# Patient Record
Sex: Male | Born: 1940 | Race: White | Hispanic: No | Marital: Married | Smoking: Former smoker
Health system: Southern US, Community
[De-identification: ages and names within clinical notes are randomized; demographics above are authoritative.]

## PROBLEM LIST (undated history)

## (undated) DIAGNOSIS — E785 Hyperlipidemia, unspecified: Secondary | ICD-10-CM

## (undated) DIAGNOSIS — I10 Essential (primary) hypertension: Secondary | ICD-10-CM

## (undated) DIAGNOSIS — E119 Type 2 diabetes mellitus without complications: Secondary | ICD-10-CM

## (undated) HISTORY — DX: Essential (primary) hypertension: I10

## (undated) HISTORY — DX: Hyperlipidemia, unspecified: E78.5

## (undated) HISTORY — PX: SHOULDER SURGERY: SHX246

---

## 2008-04-25 ENCOUNTER — Ambulatory Visit: Payer: Self-pay | Admitting: Gastroenterology

## 2008-04-25 LAB — HM COLONOSCOPY

## 2009-10-29 ENCOUNTER — Ambulatory Visit: Payer: Self-pay | Admitting: Specialist

## 2009-11-20 ENCOUNTER — Ambulatory Visit: Payer: Self-pay | Admitting: Specialist

## 2009-11-27 ENCOUNTER — Ambulatory Visit: Payer: Self-pay | Admitting: Specialist

## 2009-12-03 ENCOUNTER — Ambulatory Visit: Payer: Self-pay | Admitting: Cardiovascular Disease

## 2010-03-18 ENCOUNTER — Ambulatory Visit: Payer: Self-pay

## 2010-12-24 ENCOUNTER — Emergency Department: Payer: Self-pay | Admitting: Emergency Medicine

## 2011-01-22 DIAGNOSIS — F411 Generalized anxiety disorder: Secondary | ICD-10-CM | POA: Diagnosis not present

## 2011-01-22 DIAGNOSIS — L80 Vitiligo: Secondary | ICD-10-CM | POA: Diagnosis not present

## 2011-01-22 DIAGNOSIS — I10 Essential (primary) hypertension: Secondary | ICD-10-CM | POA: Diagnosis not present

## 2011-02-05 DIAGNOSIS — E7800 Pure hypercholesterolemia, unspecified: Secondary | ICD-10-CM | POA: Diagnosis not present

## 2011-02-05 DIAGNOSIS — F3289 Other specified depressive episodes: Secondary | ICD-10-CM | POA: Diagnosis not present

## 2011-02-05 DIAGNOSIS — F329 Major depressive disorder, single episode, unspecified: Secondary | ICD-10-CM | POA: Diagnosis not present

## 2011-02-05 DIAGNOSIS — F411 Generalized anxiety disorder: Secondary | ICD-10-CM | POA: Diagnosis not present

## 2011-02-05 DIAGNOSIS — I10 Essential (primary) hypertension: Secondary | ICD-10-CM | POA: Diagnosis not present

## 2011-02-25 DIAGNOSIS — E7800 Pure hypercholesterolemia, unspecified: Secondary | ICD-10-CM | POA: Diagnosis not present

## 2011-02-25 DIAGNOSIS — F411 Generalized anxiety disorder: Secondary | ICD-10-CM | POA: Diagnosis not present

## 2011-02-25 DIAGNOSIS — F329 Major depressive disorder, single episode, unspecified: Secondary | ICD-10-CM | POA: Diagnosis not present

## 2011-02-25 DIAGNOSIS — I10 Essential (primary) hypertension: Secondary | ICD-10-CM | POA: Diagnosis not present

## 2011-02-25 DIAGNOSIS — F3289 Other specified depressive episodes: Secondary | ICD-10-CM | POA: Diagnosis not present

## 2011-03-26 DIAGNOSIS — I951 Orthostatic hypotension: Secondary | ICD-10-CM | POA: Diagnosis not present

## 2011-03-26 DIAGNOSIS — M75120 Complete rotator cuff tear or rupture of unspecified shoulder, not specified as traumatic: Secondary | ICD-10-CM | POA: Diagnosis not present

## 2011-03-26 DIAGNOSIS — F329 Major depressive disorder, single episode, unspecified: Secondary | ICD-10-CM | POA: Diagnosis not present

## 2011-03-26 DIAGNOSIS — F3289 Other specified depressive episodes: Secondary | ICD-10-CM | POA: Diagnosis not present

## 2011-03-26 DIAGNOSIS — E7800 Pure hypercholesterolemia, unspecified: Secondary | ICD-10-CM | POA: Diagnosis not present

## 2011-03-26 DIAGNOSIS — I10 Essential (primary) hypertension: Secondary | ICD-10-CM | POA: Diagnosis not present

## 2011-03-30 DIAGNOSIS — I10 Essential (primary) hypertension: Secondary | ICD-10-CM | POA: Diagnosis not present

## 2011-04-01 DIAGNOSIS — I119 Hypertensive heart disease without heart failure: Secondary | ICD-10-CM | POA: Diagnosis not present

## 2011-04-01 DIAGNOSIS — E782 Mixed hyperlipidemia: Secondary | ICD-10-CM | POA: Diagnosis not present

## 2011-04-07 DIAGNOSIS — I359 Nonrheumatic aortic valve disorder, unspecified: Secondary | ICD-10-CM | POA: Diagnosis not present

## 2011-04-27 DIAGNOSIS — E782 Mixed hyperlipidemia: Secondary | ICD-10-CM | POA: Diagnosis not present

## 2011-04-27 DIAGNOSIS — R9431 Abnormal electrocardiogram [ECG] [EKG]: Secondary | ICD-10-CM | POA: Diagnosis not present

## 2011-04-27 DIAGNOSIS — I059 Rheumatic mitral valve disease, unspecified: Secondary | ICD-10-CM | POA: Diagnosis not present

## 2011-04-27 DIAGNOSIS — I10 Essential (primary) hypertension: Secondary | ICD-10-CM | POA: Diagnosis not present

## 2011-05-20 DIAGNOSIS — F329 Major depressive disorder, single episode, unspecified: Secondary | ICD-10-CM | POA: Diagnosis not present

## 2011-05-20 DIAGNOSIS — E7800 Pure hypercholesterolemia, unspecified: Secondary | ICD-10-CM | POA: Diagnosis not present

## 2011-05-20 DIAGNOSIS — I951 Orthostatic hypotension: Secondary | ICD-10-CM | POA: Diagnosis not present

## 2011-05-20 DIAGNOSIS — Z1339 Encounter for screening examination for other mental health and behavioral disorders: Secondary | ICD-10-CM | POA: Diagnosis not present

## 2011-05-20 DIAGNOSIS — Z125 Encounter for screening for malignant neoplasm of prostate: Secondary | ICD-10-CM | POA: Diagnosis not present

## 2011-05-20 DIAGNOSIS — Z1331 Encounter for screening for depression: Secondary | ICD-10-CM | POA: Diagnosis not present

## 2011-05-20 DIAGNOSIS — Z0000 Encounter for general adult medical examination without abnormal findings: Secondary | ICD-10-CM | POA: Diagnosis not present

## 2011-05-20 DIAGNOSIS — F3289 Other specified depressive episodes: Secondary | ICD-10-CM | POA: Diagnosis not present

## 2011-06-05 DIAGNOSIS — K922 Gastrointestinal hemorrhage, unspecified: Secondary | ICD-10-CM | POA: Diagnosis not present

## 2011-06-05 DIAGNOSIS — K649 Unspecified hemorrhoids: Secondary | ICD-10-CM | POA: Diagnosis not present

## 2011-06-18 DIAGNOSIS — R195 Other fecal abnormalities: Secondary | ICD-10-CM | POA: Diagnosis not present

## 2011-06-18 DIAGNOSIS — K648 Other hemorrhoids: Secondary | ICD-10-CM | POA: Diagnosis not present

## 2011-06-18 DIAGNOSIS — K5730 Diverticulosis of large intestine without perforation or abscess without bleeding: Secondary | ICD-10-CM | POA: Diagnosis not present

## 2011-06-18 DIAGNOSIS — K921 Melena: Secondary | ICD-10-CM | POA: Diagnosis not present

## 2011-06-25 DIAGNOSIS — I059 Rheumatic mitral valve disease, unspecified: Secondary | ICD-10-CM | POA: Diagnosis not present

## 2011-06-25 DIAGNOSIS — E782 Mixed hyperlipidemia: Secondary | ICD-10-CM | POA: Diagnosis not present

## 2011-06-25 DIAGNOSIS — I119 Hypertensive heart disease without heart failure: Secondary | ICD-10-CM | POA: Diagnosis not present

## 2011-07-20 DIAGNOSIS — S92009A Unspecified fracture of unspecified calcaneus, initial encounter for closed fracture: Secondary | ICD-10-CM | POA: Diagnosis not present

## 2011-09-03 DIAGNOSIS — L408 Other psoriasis: Secondary | ICD-10-CM | POA: Diagnosis not present

## 2011-09-03 DIAGNOSIS — L821 Other seborrheic keratosis: Secondary | ICD-10-CM | POA: Diagnosis not present

## 2011-09-03 DIAGNOSIS — D692 Other nonthrombocytopenic purpura: Secondary | ICD-10-CM | POA: Diagnosis not present

## 2011-09-03 DIAGNOSIS — L819 Disorder of pigmentation, unspecified: Secondary | ICD-10-CM | POA: Diagnosis not present

## 2011-09-03 DIAGNOSIS — L570 Actinic keratosis: Secondary | ICD-10-CM | POA: Diagnosis not present

## 2011-09-22 DIAGNOSIS — F329 Major depressive disorder, single episode, unspecified: Secondary | ICD-10-CM | POA: Diagnosis not present

## 2011-09-22 DIAGNOSIS — E7800 Pure hypercholesterolemia, unspecified: Secondary | ICD-10-CM | POA: Diagnosis not present

## 2011-09-22 DIAGNOSIS — F411 Generalized anxiety disorder: Secondary | ICD-10-CM | POA: Diagnosis not present

## 2011-09-22 DIAGNOSIS — E559 Vitamin D deficiency, unspecified: Secondary | ICD-10-CM | POA: Diagnosis not present

## 2011-09-22 DIAGNOSIS — F3289 Other specified depressive episodes: Secondary | ICD-10-CM | POA: Diagnosis not present

## 2011-09-23 DIAGNOSIS — E785 Hyperlipidemia, unspecified: Secondary | ICD-10-CM | POA: Diagnosis not present

## 2011-09-23 DIAGNOSIS — I10 Essential (primary) hypertension: Secondary | ICD-10-CM | POA: Diagnosis not present

## 2011-09-23 DIAGNOSIS — E559 Vitamin D deficiency, unspecified: Secondary | ICD-10-CM | POA: Diagnosis not present

## 2011-11-10 DIAGNOSIS — Z23 Encounter for immunization: Secondary | ICD-10-CM | POA: Diagnosis not present

## 2011-11-25 DIAGNOSIS — M67919 Unspecified disorder of synovium and tendon, unspecified shoulder: Secondary | ICD-10-CM | POA: Diagnosis not present

## 2011-11-25 DIAGNOSIS — IMO0002 Reserved for concepts with insufficient information to code with codable children: Secondary | ICD-10-CM | POA: Diagnosis not present

## 2011-11-25 DIAGNOSIS — M75100 Unspecified rotator cuff tear or rupture of unspecified shoulder, not specified as traumatic: Secondary | ICD-10-CM | POA: Diagnosis not present

## 2011-11-25 DIAGNOSIS — M719 Bursopathy, unspecified: Secondary | ICD-10-CM | POA: Diagnosis not present

## 2011-12-09 DIAGNOSIS — H2510 Age-related nuclear cataract, unspecified eye: Secondary | ICD-10-CM | POA: Diagnosis not present

## 2011-12-15 DIAGNOSIS — M67919 Unspecified disorder of synovium and tendon, unspecified shoulder: Secondary | ICD-10-CM | POA: Diagnosis not present

## 2011-12-15 DIAGNOSIS — M75100 Unspecified rotator cuff tear or rupture of unspecified shoulder, not specified as traumatic: Secondary | ICD-10-CM | POA: Diagnosis not present

## 2011-12-15 DIAGNOSIS — IMO0002 Reserved for concepts with insufficient information to code with codable children: Secondary | ICD-10-CM | POA: Diagnosis not present

## 2011-12-15 DIAGNOSIS — M719 Bursopathy, unspecified: Secondary | ICD-10-CM | POA: Diagnosis not present

## 2012-01-11 DIAGNOSIS — M67919 Unspecified disorder of synovium and tendon, unspecified shoulder: Secondary | ICD-10-CM | POA: Diagnosis not present

## 2012-01-11 DIAGNOSIS — M75100 Unspecified rotator cuff tear or rupture of unspecified shoulder, not specified as traumatic: Secondary | ICD-10-CM | POA: Diagnosis not present

## 2012-01-11 DIAGNOSIS — IMO0002 Reserved for concepts with insufficient information to code with codable children: Secondary | ICD-10-CM | POA: Diagnosis not present

## 2012-01-13 ENCOUNTER — Ambulatory Visit: Payer: Self-pay | Admitting: Specialist

## 2012-01-13 DIAGNOSIS — Z87821 Personal history of retained foreign body fully removed: Secondary | ICD-10-CM | POA: Diagnosis not present

## 2012-01-13 DIAGNOSIS — S46819A Strain of other muscles, fascia and tendons at shoulder and upper arm level, unspecified arm, initial encounter: Secondary | ICD-10-CM | POA: Diagnosis not present

## 2012-01-13 DIAGNOSIS — S46919A Strain of unspecified muscle, fascia and tendon at shoulder and upper arm level, unspecified arm, initial encounter: Secondary | ICD-10-CM | POA: Diagnosis not present

## 2012-01-13 DIAGNOSIS — Z1389 Encounter for screening for other disorder: Secondary | ICD-10-CM | POA: Diagnosis not present

## 2012-01-18 DIAGNOSIS — M75100 Unspecified rotator cuff tear or rupture of unspecified shoulder, not specified as traumatic: Secondary | ICD-10-CM | POA: Diagnosis not present

## 2012-01-18 DIAGNOSIS — M719 Bursopathy, unspecified: Secondary | ICD-10-CM | POA: Diagnosis not present

## 2012-01-18 DIAGNOSIS — IMO0002 Reserved for concepts with insufficient information to code with codable children: Secondary | ICD-10-CM | POA: Diagnosis not present

## 2012-01-18 DIAGNOSIS — M67919 Unspecified disorder of synovium and tendon, unspecified shoulder: Secondary | ICD-10-CM | POA: Diagnosis not present

## 2012-01-21 ENCOUNTER — Ambulatory Visit: Payer: Self-pay | Admitting: Specialist

## 2012-01-21 DIAGNOSIS — Z01812 Encounter for preprocedural laboratory examination: Secondary | ICD-10-CM | POA: Diagnosis not present

## 2012-01-21 DIAGNOSIS — S43429A Sprain of unspecified rotator cuff capsule, initial encounter: Secondary | ICD-10-CM | POA: Diagnosis not present

## 2012-01-21 DIAGNOSIS — I10 Essential (primary) hypertension: Secondary | ICD-10-CM | POA: Diagnosis not present

## 2012-01-21 DIAGNOSIS — Z01810 Encounter for preprocedural cardiovascular examination: Secondary | ICD-10-CM | POA: Diagnosis not present

## 2012-01-21 LAB — BASIC METABOLIC PANEL
Anion Gap: 7 (ref 7–16)
BUN: 8 mg/dL (ref 7–18)
Calcium, Total: 8.7 mg/dL (ref 8.5–10.1)
Chloride: 106 mmol/L (ref 98–107)
Co2: 28 mmol/L (ref 21–32)
EGFR (Non-African Amer.): >60
Glucose: 97 mg/dL (ref 65–99)
Osmolality: 280 (ref 275–301)
Potassium: 3.8 mmol/L (ref 3.5–5.1)

## 2012-01-21 LAB — HEMOGLOBIN: HGB: 14.5 g/dL (ref 13.0–18.0)

## 2012-01-29 DIAGNOSIS — H5704 Mydriasis: Secondary | ICD-10-CM | POA: Diagnosis not present

## 2012-01-29 DIAGNOSIS — Z87891 Personal history of nicotine dependence: Secondary | ICD-10-CM | POA: Diagnosis not present

## 2012-01-29 DIAGNOSIS — J069 Acute upper respiratory infection, unspecified: Secondary | ICD-10-CM | POA: Diagnosis not present

## 2012-02-10 ENCOUNTER — Ambulatory Visit: Payer: Self-pay | Admitting: Specialist

## 2012-02-10 DIAGNOSIS — M67919 Unspecified disorder of synovium and tendon, unspecified shoulder: Secondary | ICD-10-CM | POA: Diagnosis not present

## 2012-02-10 DIAGNOSIS — G8918 Other acute postprocedural pain: Secondary | ICD-10-CM | POA: Diagnosis not present

## 2012-02-10 DIAGNOSIS — I10 Essential (primary) hypertension: Secondary | ICD-10-CM | POA: Diagnosis not present

## 2012-02-10 DIAGNOSIS — S43429A Sprain of unspecified rotator cuff capsule, initial encounter: Secondary | ICD-10-CM | POA: Diagnosis not present

## 2012-02-10 DIAGNOSIS — M75120 Complete rotator cuff tear or rupture of unspecified shoulder, not specified as traumatic: Secondary | ICD-10-CM | POA: Diagnosis not present

## 2012-02-10 DIAGNOSIS — Z79899 Other long term (current) drug therapy: Secondary | ICD-10-CM | POA: Diagnosis not present

## 2012-02-10 DIAGNOSIS — M19019 Primary osteoarthritis, unspecified shoulder: Secondary | ICD-10-CM | POA: Diagnosis not present

## 2012-02-10 DIAGNOSIS — M25519 Pain in unspecified shoulder: Secondary | ICD-10-CM | POA: Diagnosis not present

## 2012-02-22 DIAGNOSIS — M75120 Complete rotator cuff tear or rupture of unspecified shoulder, not specified as traumatic: Secondary | ICD-10-CM | POA: Diagnosis not present

## 2012-02-22 DIAGNOSIS — M19019 Primary osteoarthritis, unspecified shoulder: Secondary | ICD-10-CM | POA: Diagnosis not present

## 2012-03-14 DIAGNOSIS — M19019 Primary osteoarthritis, unspecified shoulder: Secondary | ICD-10-CM | POA: Diagnosis not present

## 2012-03-14 DIAGNOSIS — M75120 Complete rotator cuff tear or rupture of unspecified shoulder, not specified as traumatic: Secondary | ICD-10-CM | POA: Diagnosis not present

## 2012-03-16 DIAGNOSIS — M19019 Primary osteoarthritis, unspecified shoulder: Secondary | ICD-10-CM | POA: Diagnosis not present

## 2012-03-16 DIAGNOSIS — M75120 Complete rotator cuff tear or rupture of unspecified shoulder, not specified as traumatic: Secondary | ICD-10-CM | POA: Diagnosis not present

## 2012-03-18 DIAGNOSIS — M19019 Primary osteoarthritis, unspecified shoulder: Secondary | ICD-10-CM | POA: Diagnosis not present

## 2012-03-18 DIAGNOSIS — M75120 Complete rotator cuff tear or rupture of unspecified shoulder, not specified as traumatic: Secondary | ICD-10-CM | POA: Diagnosis not present

## 2012-03-22 DIAGNOSIS — M19019 Primary osteoarthritis, unspecified shoulder: Secondary | ICD-10-CM | POA: Diagnosis not present

## 2012-03-22 DIAGNOSIS — M75120 Complete rotator cuff tear or rupture of unspecified shoulder, not specified as traumatic: Secondary | ICD-10-CM | POA: Diagnosis not present

## 2012-03-23 DIAGNOSIS — M19019 Primary osteoarthritis, unspecified shoulder: Secondary | ICD-10-CM | POA: Diagnosis not present

## 2012-03-23 DIAGNOSIS — M75120 Complete rotator cuff tear or rupture of unspecified shoulder, not specified as traumatic: Secondary | ICD-10-CM | POA: Diagnosis not present

## 2012-03-30 DIAGNOSIS — M19019 Primary osteoarthritis, unspecified shoulder: Secondary | ICD-10-CM | POA: Diagnosis not present

## 2012-03-30 DIAGNOSIS — M75120 Complete rotator cuff tear or rupture of unspecified shoulder, not specified as traumatic: Secondary | ICD-10-CM | POA: Diagnosis not present

## 2012-04-01 DIAGNOSIS — M75120 Complete rotator cuff tear or rupture of unspecified shoulder, not specified as traumatic: Secondary | ICD-10-CM | POA: Diagnosis not present

## 2012-04-01 DIAGNOSIS — M19019 Primary osteoarthritis, unspecified shoulder: Secondary | ICD-10-CM | POA: Diagnosis not present

## 2012-04-06 DIAGNOSIS — M75120 Complete rotator cuff tear or rupture of unspecified shoulder, not specified as traumatic: Secondary | ICD-10-CM | POA: Diagnosis not present

## 2012-04-06 DIAGNOSIS — M19019 Primary osteoarthritis, unspecified shoulder: Secondary | ICD-10-CM | POA: Diagnosis not present

## 2012-04-14 DIAGNOSIS — R5381 Other malaise: Secondary | ICD-10-CM | POA: Diagnosis not present

## 2012-04-14 DIAGNOSIS — M129 Arthropathy, unspecified: Secondary | ICD-10-CM | POA: Diagnosis not present

## 2012-04-14 DIAGNOSIS — E7800 Pure hypercholesterolemia, unspecified: Secondary | ICD-10-CM | POA: Diagnosis not present

## 2012-04-14 DIAGNOSIS — I10 Essential (primary) hypertension: Secondary | ICD-10-CM | POA: Diagnosis not present

## 2012-04-14 DIAGNOSIS — R5383 Other fatigue: Secondary | ICD-10-CM | POA: Diagnosis not present

## 2012-04-15 DIAGNOSIS — R5381 Other malaise: Secondary | ICD-10-CM | POA: Diagnosis not present

## 2012-04-15 DIAGNOSIS — E785 Hyperlipidemia, unspecified: Secondary | ICD-10-CM | POA: Diagnosis not present

## 2012-04-15 DIAGNOSIS — R5383 Other fatigue: Secondary | ICD-10-CM | POA: Diagnosis not present

## 2012-04-20 DIAGNOSIS — L408 Other psoriasis: Secondary | ICD-10-CM | POA: Diagnosis not present

## 2012-04-20 DIAGNOSIS — L989 Disorder of the skin and subcutaneous tissue, unspecified: Secondary | ICD-10-CM | POA: Diagnosis not present

## 2012-05-18 DIAGNOSIS — Z0000 Encounter for general adult medical examination without abnormal findings: Secondary | ICD-10-CM | POA: Diagnosis not present

## 2012-05-18 DIAGNOSIS — E7800 Pure hypercholesterolemia, unspecified: Secondary | ICD-10-CM | POA: Diagnosis not present

## 2012-05-18 DIAGNOSIS — M129 Arthropathy, unspecified: Secondary | ICD-10-CM | POA: Diagnosis not present

## 2012-05-18 DIAGNOSIS — Z125 Encounter for screening for malignant neoplasm of prostate: Secondary | ICD-10-CM | POA: Diagnosis not present

## 2012-05-18 DIAGNOSIS — I10 Essential (primary) hypertension: Secondary | ICD-10-CM | POA: Diagnosis not present

## 2012-06-06 DIAGNOSIS — E782 Mixed hyperlipidemia: Secondary | ICD-10-CM | POA: Diagnosis not present

## 2012-06-06 DIAGNOSIS — I059 Rheumatic mitral valve disease, unspecified: Secondary | ICD-10-CM | POA: Diagnosis not present

## 2012-06-06 DIAGNOSIS — I119 Hypertensive heart disease without heart failure: Secondary | ICD-10-CM | POA: Diagnosis not present

## 2012-06-24 DIAGNOSIS — L259 Unspecified contact dermatitis, unspecified cause: Secondary | ICD-10-CM | POA: Diagnosis not present

## 2012-06-24 DIAGNOSIS — R05 Cough: Secondary | ICD-10-CM | POA: Diagnosis not present

## 2012-06-24 DIAGNOSIS — R059 Cough, unspecified: Secondary | ICD-10-CM | POA: Diagnosis not present

## 2012-06-24 DIAGNOSIS — M129 Arthropathy, unspecified: Secondary | ICD-10-CM | POA: Diagnosis not present

## 2012-06-24 DIAGNOSIS — I10 Essential (primary) hypertension: Secondary | ICD-10-CM | POA: Diagnosis not present

## 2012-07-21 DIAGNOSIS — M129 Arthropathy, unspecified: Secondary | ICD-10-CM | POA: Diagnosis not present

## 2012-07-21 DIAGNOSIS — G4700 Insomnia, unspecified: Secondary | ICD-10-CM | POA: Diagnosis not present

## 2012-07-21 DIAGNOSIS — L4050 Arthropathic psoriasis, unspecified: Secondary | ICD-10-CM | POA: Diagnosis not present

## 2012-07-21 DIAGNOSIS — L80 Vitiligo: Secondary | ICD-10-CM | POA: Diagnosis not present

## 2012-08-10 DIAGNOSIS — E782 Mixed hyperlipidemia: Secondary | ICD-10-CM | POA: Diagnosis not present

## 2012-08-10 DIAGNOSIS — I119 Hypertensive heart disease without heart failure: Secondary | ICD-10-CM | POA: Diagnosis not present

## 2012-08-10 DIAGNOSIS — I059 Rheumatic mitral valve disease, unspecified: Secondary | ICD-10-CM | POA: Diagnosis not present

## 2012-10-04 DIAGNOSIS — Z23 Encounter for immunization: Secondary | ICD-10-CM | POA: Diagnosis not present

## 2012-12-07 DIAGNOSIS — L408 Other psoriasis: Secondary | ICD-10-CM | POA: Diagnosis not present

## 2012-12-07 DIAGNOSIS — L570 Actinic keratosis: Secondary | ICD-10-CM | POA: Diagnosis not present

## 2012-12-07 DIAGNOSIS — L819 Disorder of pigmentation, unspecified: Secondary | ICD-10-CM | POA: Diagnosis not present

## 2012-12-07 DIAGNOSIS — Z85828 Personal history of other malignant neoplasm of skin: Secondary | ICD-10-CM | POA: Diagnosis not present

## 2012-12-07 DIAGNOSIS — Z0189 Encounter for other specified special examinations: Secondary | ICD-10-CM | POA: Diagnosis not present

## 2012-12-12 DIAGNOSIS — H2510 Age-related nuclear cataract, unspecified eye: Secondary | ICD-10-CM | POA: Diagnosis not present

## 2012-12-14 DIAGNOSIS — I10 Essential (primary) hypertension: Secondary | ICD-10-CM | POA: Diagnosis not present

## 2012-12-14 DIAGNOSIS — E785 Hyperlipidemia, unspecified: Secondary | ICD-10-CM | POA: Diagnosis not present

## 2012-12-14 DIAGNOSIS — R9431 Abnormal electrocardiogram [ECG] [EKG]: Secondary | ICD-10-CM | POA: Diagnosis not present

## 2012-12-14 DIAGNOSIS — I059 Rheumatic mitral valve disease, unspecified: Secondary | ICD-10-CM | POA: Diagnosis not present

## 2012-12-24 DIAGNOSIS — L4050 Arthropathic psoriasis, unspecified: Secondary | ICD-10-CM | POA: Diagnosis not present

## 2012-12-24 DIAGNOSIS — J069 Acute upper respiratory infection, unspecified: Secondary | ICD-10-CM | POA: Diagnosis not present

## 2012-12-24 DIAGNOSIS — L80 Vitiligo: Secondary | ICD-10-CM | POA: Diagnosis not present

## 2012-12-24 DIAGNOSIS — M129 Arthropathy, unspecified: Secondary | ICD-10-CM | POA: Diagnosis not present

## 2012-12-30 DIAGNOSIS — J029 Acute pharyngitis, unspecified: Secondary | ICD-10-CM | POA: Diagnosis not present

## 2013-01-19 DIAGNOSIS — J309 Allergic rhinitis, unspecified: Secondary | ICD-10-CM | POA: Diagnosis not present

## 2013-01-19 DIAGNOSIS — M129 Arthropathy, unspecified: Secondary | ICD-10-CM | POA: Diagnosis not present

## 2013-01-19 DIAGNOSIS — J42 Unspecified chronic bronchitis: Secondary | ICD-10-CM | POA: Diagnosis not present

## 2013-01-19 DIAGNOSIS — I10 Essential (primary) hypertension: Secondary | ICD-10-CM | POA: Diagnosis not present

## 2013-04-18 DIAGNOSIS — R42 Dizziness and giddiness: Secondary | ICD-10-CM | POA: Diagnosis not present

## 2013-04-18 DIAGNOSIS — I119 Hypertensive heart disease without heart failure: Secondary | ICD-10-CM | POA: Diagnosis not present

## 2013-04-18 DIAGNOSIS — R0989 Other specified symptoms and signs involving the circulatory and respiratory systems: Secondary | ICD-10-CM | POA: Diagnosis not present

## 2013-04-18 DIAGNOSIS — R0602 Shortness of breath: Secondary | ICD-10-CM | POA: Diagnosis not present

## 2013-04-18 DIAGNOSIS — R0609 Other forms of dyspnea: Secondary | ICD-10-CM | POA: Diagnosis not present

## 2013-04-18 DIAGNOSIS — E782 Mixed hyperlipidemia: Secondary | ICD-10-CM | POA: Diagnosis not present

## 2013-04-24 DIAGNOSIS — R42 Dizziness and giddiness: Secondary | ICD-10-CM | POA: Diagnosis not present

## 2013-04-27 DIAGNOSIS — I119 Hypertensive heart disease without heart failure: Secondary | ICD-10-CM | POA: Diagnosis not present

## 2013-04-27 DIAGNOSIS — I059 Rheumatic mitral valve disease, unspecified: Secondary | ICD-10-CM | POA: Diagnosis not present

## 2013-04-27 DIAGNOSIS — R0602 Shortness of breath: Secondary | ICD-10-CM | POA: Diagnosis not present

## 2013-04-27 DIAGNOSIS — R609 Edema, unspecified: Secondary | ICD-10-CM | POA: Diagnosis not present

## 2013-04-27 DIAGNOSIS — E782 Mixed hyperlipidemia: Secondary | ICD-10-CM | POA: Diagnosis not present

## 2013-05-02 DIAGNOSIS — R059 Cough, unspecified: Secondary | ICD-10-CM | POA: Diagnosis not present

## 2013-05-02 DIAGNOSIS — I10 Essential (primary) hypertension: Secondary | ICD-10-CM | POA: Diagnosis not present

## 2013-05-02 DIAGNOSIS — J309 Allergic rhinitis, unspecified: Secondary | ICD-10-CM | POA: Diagnosis not present

## 2013-05-02 DIAGNOSIS — G4700 Insomnia, unspecified: Secondary | ICD-10-CM | POA: Diagnosis not present

## 2013-05-02 DIAGNOSIS — R05 Cough: Secondary | ICD-10-CM | POA: Diagnosis not present

## 2013-05-04 DIAGNOSIS — R82998 Other abnormal findings in urine: Secondary | ICD-10-CM | POA: Diagnosis not present

## 2013-05-10 DIAGNOSIS — E785 Hyperlipidemia, unspecified: Secondary | ICD-10-CM | POA: Diagnosis not present

## 2013-05-10 DIAGNOSIS — I059 Rheumatic mitral valve disease, unspecified: Secondary | ICD-10-CM | POA: Diagnosis not present

## 2013-05-10 DIAGNOSIS — I10 Essential (primary) hypertension: Secondary | ICD-10-CM | POA: Diagnosis not present

## 2013-05-23 DIAGNOSIS — Z0000 Encounter for general adult medical examination without abnormal findings: Secondary | ICD-10-CM | POA: Diagnosis not present

## 2013-05-23 DIAGNOSIS — Z1339 Encounter for screening examination for other mental health and behavioral disorders: Secondary | ICD-10-CM | POA: Diagnosis not present

## 2013-05-23 DIAGNOSIS — Z1331 Encounter for screening for depression: Secondary | ICD-10-CM | POA: Diagnosis not present

## 2013-05-23 DIAGNOSIS — R059 Cough, unspecified: Secondary | ICD-10-CM | POA: Diagnosis not present

## 2013-05-23 DIAGNOSIS — R05 Cough: Secondary | ICD-10-CM | POA: Diagnosis not present

## 2013-05-23 DIAGNOSIS — I10 Essential (primary) hypertension: Secondary | ICD-10-CM | POA: Diagnosis not present

## 2013-05-23 DIAGNOSIS — J309 Allergic rhinitis, unspecified: Secondary | ICD-10-CM | POA: Diagnosis not present

## 2013-05-25 DIAGNOSIS — Z125 Encounter for screening for malignant neoplasm of prostate: Secondary | ICD-10-CM | POA: Diagnosis not present

## 2013-06-27 DIAGNOSIS — F411 Generalized anxiety disorder: Secondary | ICD-10-CM | POA: Diagnosis not present

## 2013-06-27 DIAGNOSIS — I10 Essential (primary) hypertension: Secondary | ICD-10-CM | POA: Diagnosis not present

## 2013-06-27 DIAGNOSIS — G4700 Insomnia, unspecified: Secondary | ICD-10-CM | POA: Diagnosis not present

## 2013-06-27 DIAGNOSIS — F320 Major depressive disorder, single episode, mild: Secondary | ICD-10-CM | POA: Diagnosis not present

## 2013-07-05 DIAGNOSIS — F321 Major depressive disorder, single episode, moderate: Secondary | ICD-10-CM | POA: Diagnosis not present

## 2013-07-05 DIAGNOSIS — F329 Major depressive disorder, single episode, unspecified: Secondary | ICD-10-CM | POA: Diagnosis not present

## 2013-07-05 DIAGNOSIS — F411 Generalized anxiety disorder: Secondary | ICD-10-CM | POA: Diagnosis not present

## 2013-07-05 DIAGNOSIS — I10 Essential (primary) hypertension: Secondary | ICD-10-CM | POA: Diagnosis not present

## 2013-07-05 DIAGNOSIS — F3289 Other specified depressive episodes: Secondary | ICD-10-CM | POA: Diagnosis not present

## 2013-07-24 DIAGNOSIS — F410 Panic disorder [episodic paroxysmal anxiety] without agoraphobia: Secondary | ICD-10-CM | POA: Diagnosis not present

## 2013-07-24 DIAGNOSIS — F320 Major depressive disorder, single episode, mild: Secondary | ICD-10-CM | POA: Diagnosis not present

## 2013-07-26 DIAGNOSIS — R7309 Other abnormal glucose: Secondary | ICD-10-CM | POA: Diagnosis not present

## 2013-07-26 DIAGNOSIS — F411 Generalized anxiety disorder: Secondary | ICD-10-CM | POA: Diagnosis not present

## 2013-07-26 DIAGNOSIS — I10 Essential (primary) hypertension: Secondary | ICD-10-CM | POA: Diagnosis not present

## 2013-07-26 DIAGNOSIS — E559 Vitamin D deficiency, unspecified: Secondary | ICD-10-CM | POA: Diagnosis not present

## 2013-07-26 DIAGNOSIS — F320 Major depressive disorder, single episode, mild: Secondary | ICD-10-CM | POA: Diagnosis not present

## 2013-07-26 DIAGNOSIS — G4700 Insomnia, unspecified: Secondary | ICD-10-CM | POA: Diagnosis not present

## 2013-07-27 DIAGNOSIS — E785 Hyperlipidemia, unspecified: Secondary | ICD-10-CM | POA: Diagnosis not present

## 2013-07-27 DIAGNOSIS — R5383 Other fatigue: Secondary | ICD-10-CM | POA: Diagnosis not present

## 2013-07-27 DIAGNOSIS — R5381 Other malaise: Secondary | ICD-10-CM | POA: Diagnosis not present

## 2013-07-27 DIAGNOSIS — E559 Vitamin D deficiency, unspecified: Secondary | ICD-10-CM | POA: Diagnosis not present

## 2013-08-07 DIAGNOSIS — F320 Major depressive disorder, single episode, mild: Secondary | ICD-10-CM | POA: Diagnosis not present

## 2013-08-07 DIAGNOSIS — F410 Panic disorder [episodic paroxysmal anxiety] without agoraphobia: Secondary | ICD-10-CM | POA: Diagnosis not present

## 2013-08-25 DIAGNOSIS — R7309 Other abnormal glucose: Secondary | ICD-10-CM | POA: Diagnosis not present

## 2013-08-25 DIAGNOSIS — F411 Generalized anxiety disorder: Secondary | ICD-10-CM | POA: Diagnosis not present

## 2013-08-25 DIAGNOSIS — E559 Vitamin D deficiency, unspecified: Secondary | ICD-10-CM | POA: Diagnosis not present

## 2013-08-25 DIAGNOSIS — J0120 Acute ethmoidal sinusitis, unspecified: Secondary | ICD-10-CM | POA: Diagnosis not present

## 2013-08-25 DIAGNOSIS — G4700 Insomnia, unspecified: Secondary | ICD-10-CM | POA: Diagnosis not present

## 2013-08-25 DIAGNOSIS — I10 Essential (primary) hypertension: Secondary | ICD-10-CM | POA: Diagnosis not present

## 2013-08-30 DIAGNOSIS — F410 Panic disorder [episodic paroxysmal anxiety] without agoraphobia: Secondary | ICD-10-CM | POA: Diagnosis not present

## 2013-08-30 DIAGNOSIS — F320 Major depressive disorder, single episode, mild: Secondary | ICD-10-CM | POA: Diagnosis not present

## 2013-09-27 DIAGNOSIS — G4700 Insomnia, unspecified: Secondary | ICD-10-CM | POA: Diagnosis not present

## 2013-09-27 DIAGNOSIS — F410 Panic disorder [episodic paroxysmal anxiety] without agoraphobia: Secondary | ICD-10-CM | POA: Diagnosis not present

## 2013-09-27 DIAGNOSIS — L0291 Cutaneous abscess, unspecified: Secondary | ICD-10-CM | POA: Diagnosis not present

## 2013-09-27 DIAGNOSIS — R5383 Other fatigue: Secondary | ICD-10-CM | POA: Diagnosis not present

## 2013-09-27 DIAGNOSIS — F320 Major depressive disorder, single episode, mild: Secondary | ICD-10-CM | POA: Diagnosis not present

## 2013-09-27 DIAGNOSIS — R5381 Other malaise: Secondary | ICD-10-CM | POA: Diagnosis not present

## 2013-09-27 DIAGNOSIS — R7309 Other abnormal glucose: Secondary | ICD-10-CM | POA: Diagnosis not present

## 2013-09-27 DIAGNOSIS — F411 Generalized anxiety disorder: Secondary | ICD-10-CM | POA: Diagnosis not present

## 2013-09-27 DIAGNOSIS — G589 Mononeuropathy, unspecified: Secondary | ICD-10-CM | POA: Diagnosis not present

## 2013-09-27 DIAGNOSIS — R209 Unspecified disturbances of skin sensation: Secondary | ICD-10-CM | POA: Diagnosis not present

## 2013-09-27 DIAGNOSIS — L0390 Cellulitis, unspecified: Secondary | ICD-10-CM | POA: Diagnosis not present

## 2013-09-27 LAB — BASIC METABOLIC PANEL WITH GFR
BUN: 6 mg/dL (ref 4–21)
Creatinine: 1 mg/dL (ref <=1.3)
Glucose: 139 mg/dL
Potassium: 5.2 mmol/L (ref 3.4–5.3)
Sodium: 139 mmol/L (ref 137–147)

## 2013-09-27 LAB — TSH: TSH: 2.12 u[IU]/mL (ref <=5.90)

## 2013-09-27 LAB — CBC AND DIFFERENTIAL
HCT: 45 % (ref 41–53)
Hemoglobin: 15.2 g/dL (ref 13.5–17.5)
Neutrophils Absolute: 62 /uL
Platelets: 195 10*3/uL (ref 150–399)
WBC: 5.8 10*3/mL

## 2013-09-27 LAB — HEPATIC FUNCTION PANEL
ALT: 18 U/L (ref 10–40)
AST: 15 U/L (ref 14–40)
Alkaline Phosphatase: 84 U/L (ref 25–125)
Bilirubin, Total: 0.6 mg/dL

## 2013-09-27 LAB — LIPID PANEL
Cholesterol: 170 mg/dL (ref 0–200)
HDL: 38 mg/dL (ref 35–70)
LDL Cholesterol: 102 mg/dL
LDl/HDL Ratio: 2.7
Triglycerides: 148 mg/dL (ref 40–160)

## 2013-09-27 LAB — HEMOGLOBIN A1C: Hgb A1c MFr Bld: 5.7 % (ref 4.0–6.0)

## 2013-09-29 DIAGNOSIS — F411 Generalized anxiety disorder: Secondary | ICD-10-CM | POA: Diagnosis not present

## 2013-10-05 DIAGNOSIS — F419 Anxiety disorder, unspecified: Secondary | ICD-10-CM | POA: Diagnosis not present

## 2013-10-10 DIAGNOSIS — Z23 Encounter for immunization: Secondary | ICD-10-CM | POA: Diagnosis not present

## 2013-10-13 ENCOUNTER — Emergency Department: Payer: Self-pay | Admitting: Emergency Medicine

## 2013-10-13 DIAGNOSIS — R42 Dizziness and giddiness: Secondary | ICD-10-CM | POA: Diagnosis not present

## 2013-10-13 DIAGNOSIS — H18821 Corneal disorder due to contact lens, right eye: Secondary | ICD-10-CM | POA: Diagnosis not present

## 2013-10-13 DIAGNOSIS — I10 Essential (primary) hypertension: Secondary | ICD-10-CM | POA: Diagnosis not present

## 2013-10-13 DIAGNOSIS — Z79899 Other long term (current) drug therapy: Secondary | ICD-10-CM | POA: Diagnosis not present

## 2013-10-13 LAB — BASIC METABOLIC PANEL
Anion Gap: 9 (ref 7–16)
BUN: 18 mg/dL (ref 7–18)
Calcium, Total: 9 mg/dL (ref 8.5–10.1)
Chloride: 105 mmol/L (ref 98–107)
Co2: 25 mmol/L (ref 21–32)
Creatinine: 1.14 mg/dL (ref 0.60–1.30)
EGFR (African American): >60
EGFR (Non-African Amer.): >60
GLUCOSE: 103 mg/dL — AB (ref 65–99)
OSMOLALITY: 280 (ref 275–301)
POTASSIUM: 4.6 mmol/L (ref 3.5–5.1)
Sodium: 139 mmol/L (ref 136–145)

## 2013-10-13 LAB — URINALYSIS, COMPLETE
BACTERIA: NONE SEEN
BILIRUBIN, UR: NEGATIVE
BLOOD: NEGATIVE
Glucose,UR: NEGATIVE mg/dL (ref 0–75)
KETONE: NEGATIVE
Leukocyte Esterase: NEGATIVE
Nitrite: NEGATIVE
Ph: 6 (ref 4.5–8.0)
Protein: NEGATIVE
RBC,UR: NONE SEEN /HPF (ref 0–5)
SPECIFIC GRAVITY: 1.006 (ref 1.003–1.030)

## 2013-10-13 LAB — CBC WITH DIFFERENTIAL/PLATELET
BASOS ABS: 0.1 10*3/uL (ref 0.0–0.1)
Basophil %: 1.4 %
EOS ABS: 0.1 10*3/uL (ref 0.0–0.7)
EOS PCT: 1.5 %
HCT: 45 % (ref 40.0–52.0)
HGB: 15.2 g/dL (ref 13.0–18.0)
LYMPHS ABS: 1.3 10*3/uL (ref 1.0–3.6)
Lymphocyte %: 16.8 %
MCH: 31.3 pg (ref 26.0–34.0)
MCHC: 33.9 g/dL (ref 32.0–36.0)
MCV: 92 fL (ref 80–100)
MONO ABS: 0.6 x10 3/mm (ref 0.2–1.0)
Monocyte %: 7.8 %
NEUTROS PCT: 72.5 %
Neutrophil #: 5.7 10*3/uL (ref 1.4–6.5)
Platelet: 194 10*3/uL (ref 150–440)
RBC: 4.87 10*6/uL (ref 4.40–5.90)
RDW: 13.3 % (ref 11.5–14.5)
WBC: 7.9 10*3/uL (ref 3.8–10.6)

## 2013-10-13 LAB — TROPONIN I: Troponin-I: 0.03 ng/mL

## 2013-10-14 LAB — TROPONIN I: Troponin-I: 0.04 ng/mL

## 2013-10-17 DIAGNOSIS — Z8679 Personal history of other diseases of the circulatory system: Secondary | ICD-10-CM | POA: Insufficient documentation

## 2013-10-17 DIAGNOSIS — F410 Panic disorder [episodic paroxysmal anxiety] without agoraphobia: Secondary | ICD-10-CM | POA: Insufficient documentation

## 2013-10-17 DIAGNOSIS — I38 Endocarditis, valve unspecified: Secondary | ICD-10-CM | POA: Insufficient documentation

## 2013-10-17 DIAGNOSIS — E782 Mixed hyperlipidemia: Secondary | ICD-10-CM | POA: Diagnosis not present

## 2013-10-17 DIAGNOSIS — I10 Essential (primary) hypertension: Secondary | ICD-10-CM | POA: Diagnosis not present

## 2013-10-17 DIAGNOSIS — Z87898 Personal history of other specified conditions: Secondary | ICD-10-CM | POA: Insufficient documentation

## 2013-10-19 DIAGNOSIS — F410 Panic disorder [episodic paroxysmal anxiety] without agoraphobia: Secondary | ICD-10-CM | POA: Diagnosis not present

## 2013-10-19 DIAGNOSIS — F331 Major depressive disorder, recurrent, moderate: Secondary | ICD-10-CM | POA: Diagnosis not present

## 2013-10-19 DIAGNOSIS — F419 Anxiety disorder, unspecified: Secondary | ICD-10-CM | POA: Diagnosis not present

## 2013-10-24 DIAGNOSIS — F419 Anxiety disorder, unspecified: Secondary | ICD-10-CM | POA: Diagnosis not present

## 2013-11-03 DIAGNOSIS — F419 Anxiety disorder, unspecified: Secondary | ICD-10-CM | POA: Diagnosis not present

## 2013-11-09 DIAGNOSIS — F419 Anxiety disorder, unspecified: Secondary | ICD-10-CM | POA: Diagnosis not present

## 2013-11-10 DIAGNOSIS — H269 Unspecified cataract: Secondary | ICD-10-CM | POA: Diagnosis not present

## 2013-11-17 DIAGNOSIS — F419 Anxiety disorder, unspecified: Secondary | ICD-10-CM | POA: Diagnosis not present

## 2013-11-24 DIAGNOSIS — F419 Anxiety disorder, unspecified: Secondary | ICD-10-CM | POA: Diagnosis not present

## 2013-12-04 DIAGNOSIS — I10 Essential (primary) hypertension: Secondary | ICD-10-CM | POA: Diagnosis not present

## 2013-12-04 DIAGNOSIS — E780 Pure hypercholesterolemia: Secondary | ICD-10-CM | POA: Diagnosis not present

## 2013-12-04 DIAGNOSIS — Z23 Encounter for immunization: Secondary | ICD-10-CM | POA: Diagnosis not present

## 2013-12-04 DIAGNOSIS — F410 Panic disorder [episodic paroxysmal anxiety] without agoraphobia: Secondary | ICD-10-CM | POA: Diagnosis not present

## 2013-12-04 DIAGNOSIS — F320 Major depressive disorder, single episode, mild: Secondary | ICD-10-CM | POA: Diagnosis not present

## 2013-12-06 DIAGNOSIS — F331 Major depressive disorder, recurrent, moderate: Secondary | ICD-10-CM | POA: Diagnosis not present

## 2013-12-06 DIAGNOSIS — F410 Panic disorder [episodic paroxysmal anxiety] without agoraphobia: Secondary | ICD-10-CM | POA: Diagnosis not present

## 2013-12-07 DIAGNOSIS — F419 Anxiety disorder, unspecified: Secondary | ICD-10-CM | POA: Diagnosis not present

## 2013-12-13 DIAGNOSIS — R238 Other skin changes: Secondary | ICD-10-CM | POA: Diagnosis not present

## 2013-12-13 DIAGNOSIS — L0100 Impetigo, unspecified: Secondary | ICD-10-CM | POA: Diagnosis not present

## 2013-12-13 DIAGNOSIS — R21 Rash and other nonspecific skin eruption: Secondary | ICD-10-CM | POA: Diagnosis not present

## 2013-12-13 DIAGNOSIS — L409 Psoriasis, unspecified: Secondary | ICD-10-CM | POA: Diagnosis not present

## 2013-12-13 DIAGNOSIS — L80 Vitiligo: Secondary | ICD-10-CM | POA: Diagnosis not present

## 2013-12-20 DIAGNOSIS — F419 Anxiety disorder, unspecified: Secondary | ICD-10-CM | POA: Diagnosis not present

## 2014-01-12 DIAGNOSIS — F419 Anxiety disorder, unspecified: Secondary | ICD-10-CM | POA: Diagnosis not present

## 2014-01-25 DIAGNOSIS — F419 Anxiety disorder, unspecified: Secondary | ICD-10-CM | POA: Diagnosis not present

## 2014-02-09 DIAGNOSIS — F331 Major depressive disorder, recurrent, moderate: Secondary | ICD-10-CM | POA: Diagnosis not present

## 2014-02-09 DIAGNOSIS — F410 Panic disorder [episodic paroxysmal anxiety] without agoraphobia: Secondary | ICD-10-CM | POA: Diagnosis not present

## 2014-02-16 DIAGNOSIS — F419 Anxiety disorder, unspecified: Secondary | ICD-10-CM | POA: Diagnosis not present

## 2014-02-22 DIAGNOSIS — R05 Cough: Secondary | ICD-10-CM | POA: Diagnosis not present

## 2014-02-22 DIAGNOSIS — Z23 Encounter for immunization: Secondary | ICD-10-CM | POA: Diagnosis not present

## 2014-02-22 DIAGNOSIS — J40 Bronchitis, not specified as acute or chronic: Secondary | ICD-10-CM | POA: Diagnosis not present

## 2014-02-22 DIAGNOSIS — I10 Essential (primary) hypertension: Secondary | ICD-10-CM | POA: Diagnosis not present

## 2014-02-22 DIAGNOSIS — E780 Pure hypercholesterolemia: Secondary | ICD-10-CM | POA: Diagnosis not present

## 2014-02-26 DIAGNOSIS — H2512 Age-related nuclear cataract, left eye: Secondary | ICD-10-CM | POA: Diagnosis not present

## 2014-02-28 DIAGNOSIS — E782 Mixed hyperlipidemia: Secondary | ICD-10-CM | POA: Diagnosis not present

## 2014-02-28 DIAGNOSIS — I10 Essential (primary) hypertension: Secondary | ICD-10-CM | POA: Diagnosis not present

## 2014-03-02 DIAGNOSIS — H2512 Age-related nuclear cataract, left eye: Secondary | ICD-10-CM | POA: Diagnosis not present

## 2014-03-08 ENCOUNTER — Ambulatory Visit: Payer: Self-pay | Admitting: Ophthalmology

## 2014-03-08 DIAGNOSIS — H2512 Age-related nuclear cataract, left eye: Secondary | ICD-10-CM | POA: Diagnosis not present

## 2014-03-08 DIAGNOSIS — I10 Essential (primary) hypertension: Secondary | ICD-10-CM | POA: Diagnosis not present

## 2014-03-08 DIAGNOSIS — M1990 Unspecified osteoarthritis, unspecified site: Secondary | ICD-10-CM | POA: Diagnosis not present

## 2014-03-08 DIAGNOSIS — Z7982 Long term (current) use of aspirin: Secondary | ICD-10-CM | POA: Diagnosis not present

## 2014-03-16 DIAGNOSIS — F419 Anxiety disorder, unspecified: Secondary | ICD-10-CM | POA: Diagnosis not present

## 2014-03-30 DIAGNOSIS — F419 Anxiety disorder, unspecified: Secondary | ICD-10-CM | POA: Diagnosis not present

## 2014-04-11 DIAGNOSIS — F419 Anxiety disorder, unspecified: Secondary | ICD-10-CM | POA: Diagnosis not present

## 2014-04-24 DIAGNOSIS — F419 Anxiety disorder, unspecified: Secondary | ICD-10-CM | POA: Diagnosis not present

## 2014-04-27 NOTE — Op Note (Signed)
PATIENT NAME:  William Khan, William Khan MR#:  [id number] DATE OF BIRTH:  07-23-1940  DATE OF PROCEDURE:  02/10/2012  PREOPERATIVE DIAGNOSES:  1.  Large rotator cuff tear, right shoulder.  2.  Severe degenerative arthritis of acromioclavicular joint, right shoulder.   PROCEDURES: 1.  Open repair of large rotator cuff tear, right shoulder.  2.  Open excision distal right clavicle, right shoulder.   SURGEON:  Christophe Louis, M.D.   ANESTHESIA:  Block and general.   COMPLICATIONS:  None.   ESTIMATED BLOOD LOSS: 100 mL.   DESCRIPTION OF PROCEDURE: One gram of Ancef was given intravenously prior to the procedure. General anesthesia is induced. Supraclavicular block anesthesia had previously been administered in the same-day surgery area. The patient was placed in the lawn chair position and secured in the usual manner for right shoulder surgery using the beanbag. The right shoulder and arm were thoroughly prepped with ChloraPrep and draped in standard sterile fashion. First, a small transverse incision is made directly over the distal clavicle. The dissection is carefully carried down to the bone and the acromioclavicular joint identified.  The saw was used to cut off the distal 1/2 inch of the clavicle. This is removed in piecemeal using the rongeur. Careful palpation demonstrates no residual areas of bone present. The wound is thoroughly irrigated multiple times. The cancellus bone end of the distal clavicle was covered and impacted with bone wax. The overlying fascia and muscle is then closed with 3-0 Vicryl, and the skin is closed with the skin stapler. Attention is then turned to the rotator cuff and an anterior/superior incision is made coming down over the mid aspect of the anterior acromion. The dissection is carried down to the deltoid, which is split for approximately 2 inches distal to the anterior acromion. Anterior acromioplasty is performed. There is seen to be as a large tear of the rotator cuff  with retraction of the entire cuff. Using sutures of #2 Ethibond, the cuff is then mobilized and actually is seen to pull back almost to the anatomic position. In the edge of the tendon,In the edge of the tendon, #2 Ethibond sutures are placed. The arm is then abducted. The area of the supraspinatus and infraspinatus are then rasped to remove cortical bone and create a cancellus bone surface. The rotator cuff is then sewn back down to the cancellus bone surface using the 4 #2 Ethibond sutures through drill holes with the arm abducted. The repair is seen to be tight and satisfactory. The wound is thoroughly irrigated multiple times. The muscle is then fully repaired carefully using 3-0 Vicryl. The subcutaneous tissue is closed with 3-0 Vicryl, and the skin is closed with the skin stapler. Subacromial bursa is injected with 0.5% plain Marcaine with epinephrine with 4 mg of morphine. A soft bulky dressing, and an abduction-type sling is applied. The patient is returned to the recovery room in satisfactory condition having tolerated the procedure quite well.    ____________________________ Lucas Mallow, MD ces:dm D: 02/10/2012 15:01:09 ET T: 02/10/2012 22:48:55 ET JOB#: [id number]  cc: Lucas Mallow, MD, <Dictator> Lucas Mallow MD ELECTRONICALLY SIGNED 02/15/2012 17:16

## 2014-04-30 DIAGNOSIS — F410 Panic disorder [episodic paroxysmal anxiety] without agoraphobia: Secondary | ICD-10-CM | POA: Diagnosis not present

## 2014-04-30 DIAGNOSIS — F331 Major depressive disorder, recurrent, moderate: Secondary | ICD-10-CM | POA: Diagnosis not present

## 2014-05-01 DIAGNOSIS — F419 Anxiety disorder, unspecified: Secondary | ICD-10-CM | POA: Diagnosis not present

## 2014-05-06 NOTE — Op Note (Signed)
PATIENT NAME:  William Khan, KEMP GOMES MR#:  [id number] DATE OF BIRTH:  December 03, 1940  DATE OF PROCEDURE:  03/08/2014  DIAGNOSIS CODE: H25.12 nuclear sclerotic cataract left eye been   PREOPERATIVE DIAGNOSIS:  Nuclear sclerotic cataract, left eye.  POSTOPERATIVE DIAGNOSIS:  Nuclear sclerotic cataract, left eye.  PROCEDURE:  Phacoemulsification with posterior chamber intraocular lens left eye, model SN60WF  SURGEON:  Lyla Glassing, MD  INDICATIONS:  This is a 74 year old male with decreased vision in the left eye.  PROCEDURE:  The risks and benefits of cataract surgery were discussed at length with the patient, including bleeding, infection, retinal detachment, re-operation, diplopia, ptosis, loss of vision, and loss of the eye. Informed consent was obtained. On the day of surgery, several sets of preoperative medication were administered to the operative eye including 0.5% tetracaine,1% cyclopentolate, 10% phenylephrine, 0.5% ketorolac, 0.5% gatifloxacin, and 2% lidocaine .  The patient was taken to the operating room and sedated via IV sedation. Topical tetracaine was placed in the eye. The operative eye was prepped using a diluted 10% Betadine solution and then covered in sterile drapes leaving only the operative eye exposed. A Lieberman lid speculum was placed to provide exposure. Using 0.12 forceps and a sideport blade, a paracentesis was created. Then a mixture of BSS, preservative free lidocaine, and epinephrine was injected into the anterior chamber. Next, a 2.4 mm keratome blade was used to create a two-step full-thickness clear corneal incision temporally. The cystitome and Utrata forceps were used to create a continuous capsulorrhexis in the anterior lens capsule. BSS on a hydrodissection cannula was used to perform gentle hydrodissection. Phacoemulsification was then performed to remove the nucleus. Irrigation and aspiration was performed to remove the remaining cortical material. Provisc was  injected to fill the capsular bag and anterior chamber. A 21.5 diopter SN60WF intraocular lens was injected into the capsular bag. The Connor wand was used to rotate it into proper position in the capsular bag. Irrigation and aspiration was performed to remove the remaining Viscoelastic material from the eye. BSS on a 30-gauge cannula was used to hydrate the wound. An intracameral antibiotic was administered. The wounds were checked and found to be watertight. The lid speculum and drapes were carefully removed. Several drops of Vigamox were placed in the operative eye. The eye was covered with protective eyewear. The patient was taken to the recovery area in good condition. There were no complications.   ____________________________ Lyla Glassing, MD nm:nt D: 03/08/2014 11:27:06 ET T: 03/08/2014 20:37:38 ET JOB#: [id number]  cc: Lyla Glassing, MD, <Dictator> Lyla Glassing MD ELECTRONICALLY SIGNED 03/15/2014 17:13

## 2014-05-15 DIAGNOSIS — F419 Anxiety disorder, unspecified: Secondary | ICD-10-CM | POA: Diagnosis not present

## 2014-05-24 ENCOUNTER — Ambulatory Visit
Admission: RE | Admit: 2014-05-24 | Discharge: 2014-05-24 | Disposition: A | Discharge status: Home / Self Care | Payer: Medicare Other | Source: Ambulatory Visit | Attending: Family Medicine | Admitting: Family Medicine

## 2014-05-24 ENCOUNTER — Other Ambulatory Visit: Payer: Self-pay | Admitting: Family Medicine

## 2014-05-24 DIAGNOSIS — R059 Cough, unspecified: Secondary | ICD-10-CM

## 2014-05-24 DIAGNOSIS — R05 Cough: Secondary | ICD-10-CM

## 2014-05-24 DIAGNOSIS — G4700 Insomnia, unspecified: Secondary | ICD-10-CM | POA: Diagnosis not present

## 2014-05-24 DIAGNOSIS — F419 Anxiety disorder, unspecified: Secondary | ICD-10-CM | POA: Diagnosis not present

## 2014-05-24 DIAGNOSIS — Z23 Encounter for immunization: Secondary | ICD-10-CM | POA: Diagnosis not present

## 2014-05-24 DIAGNOSIS — I10 Essential (primary) hypertension: Secondary | ICD-10-CM | POA: Diagnosis not present

## 2014-05-28 DIAGNOSIS — F331 Major depressive disorder, recurrent, moderate: Secondary | ICD-10-CM | POA: Diagnosis not present

## 2014-05-28 DIAGNOSIS — F410 Panic disorder [episodic paroxysmal anxiety] without agoraphobia: Secondary | ICD-10-CM | POA: Diagnosis not present

## 2014-05-30 DIAGNOSIS — F331 Major depressive disorder, recurrent, moderate: Secondary | ICD-10-CM | POA: Diagnosis not present

## 2014-05-30 DIAGNOSIS — F410 Panic disorder [episodic paroxysmal anxiety] without agoraphobia: Secondary | ICD-10-CM | POA: Diagnosis not present

## 2014-06-06 DIAGNOSIS — L80 Vitiligo: Secondary | ICD-10-CM | POA: Diagnosis not present

## 2014-06-06 DIAGNOSIS — L570 Actinic keratosis: Secondary | ICD-10-CM | POA: Diagnosis not present

## 2014-06-06 DIAGNOSIS — L409 Psoriasis, unspecified: Secondary | ICD-10-CM | POA: Diagnosis not present

## 2014-06-11 DIAGNOSIS — I059 Rheumatic mitral valve disease, unspecified: Secondary | ICD-10-CM | POA: Insufficient documentation

## 2014-06-11 DIAGNOSIS — N529 Male erectile dysfunction, unspecified: Secondary | ICD-10-CM | POA: Insufficient documentation

## 2014-06-11 DIAGNOSIS — E782 Mixed hyperlipidemia: Secondary | ICD-10-CM | POA: Insufficient documentation

## 2014-06-11 DIAGNOSIS — I10 Essential (primary) hypertension: Secondary | ICD-10-CM | POA: Insufficient documentation

## 2014-06-21 ENCOUNTER — Encounter: Payer: Self-pay | Admitting: Family Medicine

## 2014-06-21 ENCOUNTER — Ambulatory Visit (INDEPENDENT_AMBULATORY_CARE_PROVIDER_SITE_OTHER): Payer: Medicare Other | Admitting: Family Medicine

## 2014-06-21 VITALS — BP 118/72 | HR 64 | Temp 97.9°F | Resp 16 | Ht 70.0 in | Wt 174.0 lb

## 2014-06-21 DIAGNOSIS — Z0000 Encounter for general adult medical examination without abnormal findings: Secondary | ICD-10-CM | POA: Diagnosis not present

## 2014-06-21 NOTE — Progress Notes (Signed)
Patient ID: William Khan, male   DOB: [birth year], 74 y.o.   MRN: [id number] Patient: William Khan, Male    DOB: 08-15-1940, 74 y.o.   MRN: [id number] Visit Date: 06/21/2014  Today's Provider: Wilhemena Durie, MD   Chief Complaint  Patient presents with  . Annual Exam   Subjective:   William Khan is a 74 y.o. male who presents today for his Subsequent Annual Wellness Visit. He feels fairly well. He reports exercising, he walks on a treadmill at least every other day. He reports he is sleeping well.  Review of Systems  Patient Active Problem List   Diagnosis Date Noted  . ED (erectile dysfunction) of organic origin 06/11/2014  . Essential (primary) hypertension 06/11/2014  . Disorder of mitral valve 06/11/2014  . Combined fat and carbohydrate induced hyperlipemia 06/11/2014  . Anxiety attack 10/17/2013  . History of prolonged Q-T interval on ECG 10/17/2013  . Heart valve disease 10/17/2013  . H/O cardiovascular disorder 10/17/2013    History   Social History  . Marital Status: Married    Spouse Name: N/A  . Number of Children: N/A  . Years of Education: N/A   Occupational History  . Not on file.   Social History Main Topics  . Smoking status: Former Smoker -- 20 years    Quit date: 01/05/1990  . Smokeless tobacco: Not on file  . Alcohol Use: No  . Drug Use: No  . Sexual Activity: Not on file   Other Topics Concern  . Not on file   Social History Narrative    Past Surgical History  Procedure Laterality Date  . Shoulder surgery      His family history includes Alzheimer's disease in his mother; Arthritis in his mother; Hypertension in his mother.    Outpatient Prescriptions Prior to Visit  Medication Sig Dispense Refill  . Cholecalciferol (VITAMIN D3) 1000 UNITS CAPS Take by mouth.    . fluticasone (FLONASE) 50 MCG/ACT nasal spray     . mirtazapine (REMERON) 30 MG tablet     . pravastatin (PRAVACHOL) 40 MG tablet     . QUEtiapine (SEROQUEL) 50 MG tablet      . zolpidem (AMBIEN) 5 MG tablet     . QUEtiapine (SEROQUEL) 100 MG tablet     . aspirin EC 81 MG tablet Take by mouth.    . clobetasol (TEMOVATE) 0.05 % GEL     . clonazePAM (KLONOPIN) 0.5 MG tablet     . escitalopram (LEXAPRO) 20 MG tablet      No facility-administered medications prior to visit.    Allergies  Allergen Reactions  . Ace Inhibitors Swelling    Patient Care Team: Jerrol Banana., MD as PCP - General (Family Medicine)  Objective:   Vitals:  Filed Vitals:   06/21/14 1430  BP: 118/72  Pulse: 64  Temp: 97.9 F (36.6 C)  TempSrc: Oral  Resp: 16  Height: 5\' 10"  (1.778 m)  Weight: 174 lb (78.926 kg)    Physical Exam  Constitutional: He is oriented to person, place, and time. He appears well-developed and well-nourished.  HENT:  Head: Normocephalic and atraumatic.  Right Ear: External ear normal.  Left Ear: External ear normal.  Nose: Nose normal.  Mouth/Throat: Oropharynx is clear and moist.  Eyes: Conjunctivae and EOM are normal. Pupils are equal, round, and reactive to light.  Neck: Normal range of motion. Neck supple.  Cardiovascular: Normal rate, regular rhythm, normal heart sounds and intact  distal pulses.   Pulmonary/Chest: Effort normal and breath sounds normal.  Abdominal: Soft. Bowel sounds are normal.  Genitourinary: Penis normal.  Musculoskeletal: Normal range of motion.   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 He has normal reflexes.  Skin: Skin is warm and dry.   Vitiligo of hands and lower forearms.  Psychiatric: He has a normal mood and affect. His behavior is normal. Judgment and thought content normal.    Activities of Daily Living In your present state of health, do you have any difficulty performing the following activities: 06/21/2014  Hearing? N  Vision? N  Difficulty concentrating or making decisions? N  Walking or climbing stairs? N   Dressing or bathing? N  Doing errands, shopping? N    Fall Risk Assessment Fall Risk  06/21/2014  Falls in the past year? No     Depression Screen PHQ 2/9 Scores 06/21/2014  PHQ - 2 Score 0    Cognitive Testing - 6-CIT    Year: 0 4 points-- 0  Month: 0 3 points-- 0  Memorize "Pia Mau, [address] [place]"  Time (within 1 hour:) 0 3 points-- 0  Count backwards from 20: 0 2 4 points-- 0  Name months of year: 0 2 4 points-- 0  Repeat Address: 0 2 4 6 8 10  points--0   Total Score: 0/28  Interpretation : Normal (0-7) Abnormal (8-28)    Assessment & Plan:     Annual Wellness Visit--pt doing wel  Anxiety-pt seeing Dr Nicolasa Ducking                                                                                                   -Reviewed patient's Family Medical History Reviewed and updated list of patient's medical providers Assessment of cognitive impairment was done Assessed patient's functional ability Established a written schedule for health screening [place] Completed and Reviewed  Exercise Activities and Dietary recommendations Goals    None      Immunization History  Administered Date(s) Administered  . Influenza-Unspecified 10/05/2012  . Pneumococcal Conjugate-13 12/04/2013  . Pneumococcal Polysaccharide-23 10/14/2006  . Td 03/10/2007    Health Maintenance  Topic Date Due  . ZOSTAVAX  04/03/1998  . INFLUENZA VACCINE  08/06/2014  . TETANUS/TDAP  03/09/2017  . COLONOSCOPY  04/26/2018  . PNA vac Low Risk Adult  Completed   I have done the exam and reviewed the above chart and it is accurate to the best of my knowledge.   Discussed health benefits of physical activity, and encouraged him to engage in regular exercise appropriate for his age and condition.    Miguel Aschoff MD [facility] 06/21/2014 2:38  PM  ------------------------------------------------------------------------------------------------------------

## 2014-06-22 ENCOUNTER — Encounter: Payer: Self-pay | Admitting: Family Medicine

## 2014-06-25 DIAGNOSIS — F331 Major depressive disorder, recurrent, moderate: Secondary | ICD-10-CM | POA: Diagnosis not present

## 2014-06-25 DIAGNOSIS — F410 Panic disorder [episodic paroxysmal anxiety] without agoraphobia: Secondary | ICD-10-CM | POA: Diagnosis not present

## 2014-07-13 ENCOUNTER — Other Ambulatory Visit: Payer: Self-pay | Admitting: Family Medicine

## 2014-07-18 DIAGNOSIS — F419 Anxiety disorder, unspecified: Secondary | ICD-10-CM | POA: Diagnosis not present

## 2014-07-18 DIAGNOSIS — G3184 Mild cognitive impairment, so stated: Secondary | ICD-10-CM | POA: Diagnosis not present

## 2014-08-29 ENCOUNTER — Other Ambulatory Visit: Payer: Self-pay | Admitting: Family Medicine

## 2014-08-29 DIAGNOSIS — F0281 Dementia in other diseases classified elsewhere with behavioral disturbance: Secondary | ICD-10-CM | POA: Diagnosis not present

## 2014-08-29 DIAGNOSIS — F419 Anxiety disorder, unspecified: Secondary | ICD-10-CM | POA: Diagnosis not present

## 2014-08-29 DIAGNOSIS — G300 Alzheimer's disease with early onset: Secondary | ICD-10-CM | POA: Diagnosis not present

## 2014-09-02 ENCOUNTER — Other Ambulatory Visit: Payer: Self-pay | Admitting: Family Medicine

## 2014-10-19 DIAGNOSIS — F0281 Dementia in other diseases classified elsewhere with behavioral disturbance: Secondary | ICD-10-CM | POA: Diagnosis not present

## 2014-10-19 DIAGNOSIS — G300 Alzheimer's disease with early onset: Secondary | ICD-10-CM | POA: Diagnosis not present

## 2014-10-19 DIAGNOSIS — F419 Anxiety disorder, unspecified: Secondary | ICD-10-CM | POA: Diagnosis not present

## 2014-10-25 DIAGNOSIS — Z23 Encounter for immunization: Secondary | ICD-10-CM | POA: Diagnosis not present

## 2014-11-02 DIAGNOSIS — F419 Anxiety disorder, unspecified: Secondary | ICD-10-CM | POA: Diagnosis not present

## 2014-11-02 DIAGNOSIS — F0281 Dementia in other diseases classified elsewhere with behavioral disturbance: Secondary | ICD-10-CM | POA: Diagnosis not present

## 2014-11-02 DIAGNOSIS — G300 Alzheimer's disease with early onset: Secondary | ICD-10-CM | POA: Diagnosis not present

## 2014-11-07 ENCOUNTER — Encounter: Payer: Self-pay | Admitting: Family Medicine

## 2014-11-12 ENCOUNTER — Other Ambulatory Visit: Payer: Self-pay | Admitting: Family Medicine

## 2014-11-16 DIAGNOSIS — F0281 Dementia in other diseases classified elsewhere with behavioral disturbance: Secondary | ICD-10-CM | POA: Diagnosis not present

## 2014-11-16 DIAGNOSIS — F419 Anxiety disorder, unspecified: Secondary | ICD-10-CM | POA: Diagnosis not present

## 2014-11-16 DIAGNOSIS — G300 Alzheimer's disease with early onset: Secondary | ICD-10-CM | POA: Diagnosis not present

## 2014-11-21 ENCOUNTER — Ambulatory Visit: Payer: Medicare Other | Admitting: Family Medicine

## 2014-12-10 ENCOUNTER — Encounter: Payer: Self-pay | Admitting: Family Medicine

## 2014-12-10 ENCOUNTER — Ambulatory Visit (INDEPENDENT_AMBULATORY_CARE_PROVIDER_SITE_OTHER): Payer: Medicare Other | Admitting: Family Medicine

## 2014-12-10 VITALS — BP 112/60 | HR 80 | Temp 97.7°F | Resp 16 | Wt 177.0 lb

## 2014-12-10 DIAGNOSIS — F410 Panic disorder [episodic paroxysmal anxiety] without agoraphobia: Secondary | ICD-10-CM | POA: Diagnosis not present

## 2014-12-10 DIAGNOSIS — E782 Mixed hyperlipidemia: Secondary | ICD-10-CM | POA: Diagnosis not present

## 2014-12-10 DIAGNOSIS — I10 Essential (primary) hypertension: Secondary | ICD-10-CM

## 2014-12-10 DIAGNOSIS — Z8679 Personal history of other diseases of the circulatory system: Secondary | ICD-10-CM

## 2014-12-10 NOTE — Progress Notes (Signed)
Patient ID: William Khan, male   DOB: [birth year], 74 y.o.   MRN: [id number]    Subjective:  HPI  Hypertension, follow-up:  BP Readings from Last 3 Encounters:  12/10/14 112/60  06/21/14 118/72  05/24/14 144/84    He was last seen for hypertension 5 months ago.  BP at that visit was 118/72. Management since that visit includes none. He reports good compliance with treatment. He is not having side effects.  He is exercising. He is adherent to low salt diet.   Outside blood pressures are 120-130/70's. He is experiencing none.  Patient denies chest pain, chest pressure/discomfort, claudication, dyspnea, exertional chest pressure/discomfort, fatigue, lower extremity edema, orthopnea and palpitations.    Wt Readings from Last 3 Encounters:  12/10/14 177 lb (80.287 kg)  06/21/14 174 lb (78.926 kg)  05/24/14 177 lb (80.287 kg)   ------------------------------------------------------------------------  Lipid/Cholesterol, Follow-up:   Last seen for this 5 months ago.  Management changes since that visit include none. . Last Lipid Panel:    Component Value Date/Time   CHOL 170 09/27/2013   TRIG 148 09/27/2013   HDL 38 09/27/2013   LDLCALC 102 09/27/2013     He reports good compliance with treatment. He is not having side effects.   Wt Readings from Last 3 Encounters:  12/10/14 177 lb (80.287 kg)  06/21/14 174 lb (78.926 kg)  05/24/14 177 lb (80.287 kg)    -------------------------------------------------------------------      Prior to Admission medications   Medication Sig Start Date End Date Taking? Authorizing Provider  Cholecalciferol (VITAMIN D3) 1000 UNITS CAPS Take by mouth.   Yes Historical Provider, MD  DULoxetine (CYMBALTA) 30 MG capsule  12/03/14  Yes Historical Provider, MD  fluticasone (FLONASE) 50 MCG/ACT nasal spray USE TWO SPRAY(S) IN EACH NOSTRIL ONCE DAILY 07/13/14  Yes Richard Maceo Pro., MD  loratadine (CLARITIN) 10 MG tablet TAKE ONE TABLET  BY MOUTH ONCE DAILY AS NEEDED FOR ITCHING RASH 11/13/14  Yes Jerrol Banana., MD  LORazepam (ATIVAN) 0.5 MG tablet  11/16/14  Yes Historical Provider, MD  mirtazapine (REMERON) 30 MG tablet  12/09/13  Yes Historical Provider, MD  PARoxetine (PAXIL) 30 MG tablet  09/29/14  Yes Historical Provider, MD  pravastatin (PRAVACHOL) 40 MG tablet  11/16/13  Yes Historical Provider, MD  QUEtiapine (SEROQUEL) 50 MG tablet  12/06/13  Yes Historical Provider, MD  risperiDONE (RISPERDAL) 1 MG tablet  12/03/14  Yes Historical Provider, MD    Patient Active Problem List   Diagnosis Date Noted  . ED (erectile dysfunction) of organic origin 06/11/2014  . Essential (primary) hypertension 06/11/2014  . Disorder of mitral valve 06/11/2014  . Combined fat and carbohydrate induced hyperlipemia 06/11/2014  . Anxiety attack 10/17/2013  . History of prolonged Q-T interval on ECG 10/17/2013  . Heart valve disease 10/17/2013  . H/O cardiovascular disorder 10/17/2013  . Personal history of other diseases of the circulatory system 10/17/2013    History reviewed. No pertinent past medical history.  Social History   Social History  . Marital Status: Married    Spouse Name: N/A  . Number of Children: N/A  . Years of Education: N/A   Occupational History  . Not on file.   Social History Main Topics  . Smoking status: Former Smoker -- 20 years    Quit date: 01/05/1990  . Smokeless tobacco: Not on file  . Alcohol Use: No  . Drug Use: No  . Sexual Activity: Not on file  Other Topics Concern  . Not on file   Social History Narrative    Allergies  Allergen Reactions  . Ace Inhibitors Swelling    Review of Systems  Constitutional: Negative.   HENT: Negative.   Eyes: Negative.   Respiratory: Negative.   Cardiovascular: Negative.   Gastrointestinal: Negative.   Genitourinary: Negative.   Musculoskeletal: Negative.   Skin: Negative.   Neurological: Negative.   Endo/Heme/Allergies: Negative.     Psychiatric/Behavioral: Negative.     Immunization History  Administered Date(s) Administered  . Influenza-Unspecified 10/05/2012  . Pneumococcal Conjugate-13 12/04/2013  . Pneumococcal Polysaccharide-23 10/14/2006  . Td 03/10/2007   Objective:  BP 112/60 mmHg  Pulse 80  Temp(Src) 97.7 F (36.5 C) (Oral)  Resp 16  Wt 177 lb (80.287 kg)  Physical Exam  Constitutional: He is oriented to person, place, and time and well-developed, well-nourished, and in no distress.  Eyes: Conjunctivae and EOM are normal. Pupils are equal, round, and reactive to light.  Neck: Normal range of motion. Neck supple.  Cardiovascular: Normal rate, regular rhythm, normal heart sounds and intact distal pulses.   Pulmonary/Chest: Effort normal and breath sounds normal.  Abdominal: Soft. Bowel sounds are normal.  Musculoskeletal: Normal range of motion.  Neurological: He is alert and oriented to person, place, and time. He has normal reflexes. Gait normal. GCS score is 15.  Skin: Skin is warm.  Psychiatric: Mood, memory, affect and judgment normal.    Lab Results  Component Value Date   WBC 7.9 10/13/2013   HGB 15.2 10/13/2013   HCT 45.0 10/13/2013   PLT 194 10/13/2013   GLUCOSE 103* 10/13/2013   CHOL 170 09/27/2013   TRIG 148 09/27/2013   HDL 38 09/27/2013   LDLCALC 102 09/27/2013   TSH 2.12 09/27/2013   HGBA1C 5.7 09/27/2013    CMP     Component Value Date/Time   NA 139 10/13/2013 1817   NA 139 09/27/2013   K 4.6 10/13/2013 1817   K 5.2 09/27/2013   CL 105 10/13/2013 1817   CO2 25 10/13/2013 1817   GLUCOSE 103* 10/13/2013 1817   BUN 18 10/13/2013 1817   BUN 6 09/27/2013   CREATININE 1.14 10/13/2013 1817   CREATININE 1.0 09/27/2013   CALCIUM 9.0 10/13/2013 1817   AST 15 09/27/2013   ALT 18 09/27/2013   ALKPHOS 84 09/27/2013   GFRNONAA >60 10/13/2013 1817   GFRNONAA >60 01/21/2012 1154   GFRAA >60 10/13/2013 1817   GFRAA >60 01/21/2012 1154    Assessment and Plan :  1.  Essential (primary) hypertension  - CBC with Differential/Platelet - TSH - Comprehensive metabolic panel  2. Anxiety  Per psychiatry.  3. Combined fat and carbohydrate induced hyperlipemia  - Lipid Panel With LDL/HDL Ratio - Comprehensive metabolic panel  4. Personal history of other diseases of the circulatory system/ASCVD  Patient was seen and examined by Dr. Miguel Aschoff, and noted scribed by Webb Laws, [place] MD [facility] 12/10/2014 1:47 PM

## 2014-12-11 LAB — COMPREHENSIVE METABOLIC PANEL
ALT: 19 IU/L (ref 0–44)
AST: 17 IU/L (ref 0–40)
Albumin/Globulin Ratio: 2.1 (ref 1.1–2.5)
Albumin: 4.1 g/dL (ref 3.5–4.8)
Alkaline Phosphatase: 73 IU/L (ref 39–117)
BUN/Creatinine Ratio: 13 (ref 10–22)
BUN: 12 mg/dL (ref 8–27)
Bilirubin Total: 0.3 mg/dL (ref 0.0–1.2)
CALCIUM: 9 mg/dL (ref 8.6–10.2)
CO2: 22 mmol/L (ref 18–29)
CREATININE: 0.93 mg/dL (ref 0.76–1.27)
Chloride: 100 mmol/L (ref 97–106)
GFR, EST AFRICAN AMERICAN: 92 mL/min/{1.73_m2} (ref >59)
GFR, EST NON AFRICAN AMERICAN: 79 mL/min/{1.73_m2} (ref >59)
Globulin, Total: 2 g/dL (ref 1.5–4.5)
Glucose: 171 mg/dL — ABNORMAL HIGH (ref 65–99)
Potassium: 4.4 mmol/L (ref 3.5–5.2)
Sodium: 136 mmol/L (ref 136–144)
TOTAL PROTEIN: 6.1 g/dL (ref 6.0–8.5)

## 2014-12-11 LAB — CBC WITH DIFFERENTIAL/PLATELET
BASOS ABS: 0.1 10*3/uL (ref 0.0–0.2)
Basos: 1 %
EOS (ABSOLUTE): 0.1 10*3/uL (ref 0.0–0.4)
Eos: 2 %
HEMOGLOBIN: 13.7 g/dL (ref 12.6–17.7)
Hematocrit: 39.1 % (ref 37.5–51.0)
IMMATURE GRANS (ABS): 0 10*3/uL (ref 0.0–0.1)
Immature Granulocytes: 0 %
LYMPHS: 19 %
Lymphocytes Absolute: 1.2 10*3/uL (ref 0.7–3.1)
MCH: 31.6 pg (ref 26.6–33.0)
MCHC: 35 g/dL (ref 31.5–35.7)
MCV: 90 fL (ref 79–97)
MONOCYTES: 8 %
Monocytes Absolute: 0.5 10*3/uL (ref 0.1–0.9)
NEUTROS PCT: 70 %
Neutrophils Absolute: 4.1 10*3/uL (ref 1.4–7.0)
Platelets: 165 10*3/uL (ref 150–379)
RBC: 4.33 x10E6/uL (ref 4.14–5.80)
RDW: 13.3 % (ref 12.3–15.4)
WBC: 6 10*3/uL (ref 3.4–10.8)

## 2014-12-11 LAB — LIPID PANEL WITH LDL/HDL RATIO
CHOLESTEROL TOTAL: 139 mg/dL (ref 100–199)
HDL: 33 mg/dL — AB (ref >39)
LDL Calculated: 78 mg/dL (ref 0–99)
LDL/HDL RATIO: 2.4 ratio (ref 0.0–3.6)
TRIGLYCERIDES: 141 mg/dL (ref 0–149)
VLDL CHOLESTEROL CAL: 28 mg/dL (ref 5–40)

## 2014-12-11 LAB — TSH: TSH: 2.39 u[IU]/mL (ref 0.450–4.500)

## 2014-12-19 DIAGNOSIS — F0281 Dementia in other diseases classified elsewhere with behavioral disturbance: Secondary | ICD-10-CM | POA: Diagnosis not present

## 2014-12-19 DIAGNOSIS — G300 Alzheimer's disease with early onset: Secondary | ICD-10-CM | POA: Diagnosis not present

## 2014-12-19 DIAGNOSIS — F419 Anxiety disorder, unspecified: Secondary | ICD-10-CM | POA: Diagnosis not present

## 2015-01-28 DIAGNOSIS — F419 Anxiety disorder, unspecified: Secondary | ICD-10-CM | POA: Diagnosis not present

## 2015-01-28 DIAGNOSIS — G300 Alzheimer's disease with early onset: Secondary | ICD-10-CM | POA: Diagnosis not present

## 2015-01-28 DIAGNOSIS — F0281 Dementia in other diseases classified elsewhere with behavioral disturbance: Secondary | ICD-10-CM | POA: Diagnosis not present

## 2015-02-26 DIAGNOSIS — E782 Mixed hyperlipidemia: Secondary | ICD-10-CM | POA: Diagnosis not present

## 2015-02-26 DIAGNOSIS — I059 Rheumatic mitral valve disease, unspecified: Secondary | ICD-10-CM | POA: Diagnosis not present

## 2015-03-04 DIAGNOSIS — G308 Other Alzheimer's disease: Secondary | ICD-10-CM | POA: Diagnosis not present

## 2015-03-04 DIAGNOSIS — F419 Anxiety disorder, unspecified: Secondary | ICD-10-CM | POA: Diagnosis not present

## 2015-03-04 DIAGNOSIS — F0281 Dementia in other diseases classified elsewhere with behavioral disturbance: Secondary | ICD-10-CM | POA: Diagnosis not present

## 2015-04-01 DIAGNOSIS — F0281 Dementia in other diseases classified elsewhere with behavioral disturbance: Secondary | ICD-10-CM | POA: Diagnosis not present

## 2015-04-01 DIAGNOSIS — F419 Anxiety disorder, unspecified: Secondary | ICD-10-CM | POA: Diagnosis not present

## 2015-04-01 DIAGNOSIS — G308 Other Alzheimer's disease: Secondary | ICD-10-CM | POA: Diagnosis not present

## 2015-04-29 DIAGNOSIS — F0281 Dementia in other diseases classified elsewhere with behavioral disturbance: Secondary | ICD-10-CM | POA: Diagnosis not present

## 2015-04-29 DIAGNOSIS — G308 Other Alzheimer's disease: Secondary | ICD-10-CM | POA: Diagnosis not present

## 2015-04-29 DIAGNOSIS — F419 Anxiety disorder, unspecified: Secondary | ICD-10-CM | POA: Diagnosis not present

## 2015-05-27 DIAGNOSIS — G308 Other Alzheimer's disease: Secondary | ICD-10-CM | POA: Diagnosis not present

## 2015-05-27 DIAGNOSIS — F419 Anxiety disorder, unspecified: Secondary | ICD-10-CM | POA: Diagnosis not present

## 2015-05-27 DIAGNOSIS — F0281 Dementia in other diseases classified elsewhere with behavioral disturbance: Secondary | ICD-10-CM | POA: Diagnosis not present

## 2015-07-31 DIAGNOSIS — R251 Tremor, unspecified: Secondary | ICD-10-CM | POA: Diagnosis not present

## 2015-07-31 DIAGNOSIS — F411 Generalized anxiety disorder: Secondary | ICD-10-CM | POA: Diagnosis not present

## 2015-08-13 ENCOUNTER — Other Ambulatory Visit: Payer: Self-pay | Admitting: Family Medicine

## 2015-09-02 ENCOUNTER — Encounter: Payer: Medicare Other | Admitting: Family Medicine

## 2015-09-04 DIAGNOSIS — F411 Generalized anxiety disorder: Secondary | ICD-10-CM | POA: Diagnosis not present

## 2015-09-04 DIAGNOSIS — R251 Tremor, unspecified: Secondary | ICD-10-CM | POA: Diagnosis not present

## 2015-11-14 DIAGNOSIS — Z23 Encounter for immunization: Secondary | ICD-10-CM | POA: Diagnosis not present

## 2015-12-04 DIAGNOSIS — F411 Generalized anxiety disorder: Secondary | ICD-10-CM | POA: Diagnosis not present

## 2015-12-04 DIAGNOSIS — R251 Tremor, unspecified: Secondary | ICD-10-CM | POA: Diagnosis not present

## 2015-12-19 ENCOUNTER — Other Ambulatory Visit: Payer: Self-pay | Admitting: Family Medicine

## 2016-02-05 DIAGNOSIS — I951 Orthostatic hypotension: Secondary | ICD-10-CM | POA: Diagnosis not present

## 2016-02-05 DIAGNOSIS — R251 Tremor, unspecified: Secondary | ICD-10-CM | POA: Diagnosis not present

## 2016-02-05 DIAGNOSIS — Z8659 Personal history of other mental and behavioral disorders: Secondary | ICD-10-CM | POA: Diagnosis not present

## 2016-02-05 DIAGNOSIS — F411 Generalized anxiety disorder: Secondary | ICD-10-CM | POA: Diagnosis not present

## 2016-02-05 DIAGNOSIS — R2689 Other abnormalities of gait and mobility: Secondary | ICD-10-CM | POA: Diagnosis not present

## 2016-02-06 ENCOUNTER — Other Ambulatory Visit: Payer: Self-pay | Admitting: Nurse Practitioner

## 2016-02-06 DIAGNOSIS — R2689 Other abnormalities of gait and mobility: Secondary | ICD-10-CM

## 2016-02-06 DIAGNOSIS — R251 Tremor, unspecified: Secondary | ICD-10-CM

## 2016-02-06 DIAGNOSIS — F411 Generalized anxiety disorder: Secondary | ICD-10-CM

## 2016-02-15 ENCOUNTER — Ambulatory Visit
Admission: RE | Admit: 2016-02-15 | Discharge: 2016-02-15 | Disposition: A | Discharge status: Home / Self Care | Payer: Medicare Other | Source: Ambulatory Visit | Attending: Nurse Practitioner | Admitting: Nurse Practitioner

## 2016-02-15 DIAGNOSIS — M5021 Other cervical disc displacement,  high cervical region: Secondary | ICD-10-CM | POA: Insufficient documentation

## 2016-02-15 DIAGNOSIS — F411 Generalized anxiety disorder: Secondary | ICD-10-CM | POA: Diagnosis not present

## 2016-02-15 DIAGNOSIS — I6522 Occlusion and stenosis of left carotid artery: Secondary | ICD-10-CM | POA: Diagnosis not present

## 2016-02-15 DIAGNOSIS — I739 Peripheral vascular disease, unspecified: Secondary | ICD-10-CM | POA: Diagnosis not present

## 2016-02-15 DIAGNOSIS — R42 Dizziness and giddiness: Secondary | ICD-10-CM | POA: Diagnosis not present

## 2016-02-15 DIAGNOSIS — G319 Degenerative disease of nervous system, unspecified: Secondary | ICD-10-CM | POA: Diagnosis not present

## 2016-02-15 DIAGNOSIS — R2689 Other abnormalities of gait and mobility: Secondary | ICD-10-CM

## 2016-02-15 DIAGNOSIS — R251 Tremor, unspecified: Secondary | ICD-10-CM

## 2016-02-15 LAB — POCT I-STAT CREATININE: CREATININE: 0.9 mg/dL (ref 0.61–1.24)

## 2016-02-15 MED ORDER — GADOBENATE DIMEGLUMINE 529 MG/ML IV SOLN
16.0000 mL | Freq: Once | INTRAVENOUS | Status: AC | PRN
Start: 2016-02-15 — End: 2016-02-15
  Administered 2016-02-15: 16 mL via INTRAVENOUS

## 2016-02-20 DIAGNOSIS — R2689 Other abnormalities of gait and mobility: Secondary | ICD-10-CM | POA: Diagnosis not present

## 2016-02-20 DIAGNOSIS — M5410 Radiculopathy, site unspecified: Secondary | ICD-10-CM | POA: Diagnosis not present

## 2016-02-20 DIAGNOSIS — I951 Orthostatic hypotension: Secondary | ICD-10-CM | POA: Diagnosis not present

## 2016-02-20 DIAGNOSIS — R251 Tremor, unspecified: Secondary | ICD-10-CM | POA: Diagnosis not present

## 2016-02-24 ENCOUNTER — Other Ambulatory Visit: Payer: Self-pay | Admitting: Neurology

## 2016-02-24 DIAGNOSIS — M5410 Radiculopathy, site unspecified: Secondary | ICD-10-CM

## 2016-03-05 ENCOUNTER — Ambulatory Visit
Admission: RE | Admit: 2016-03-05 | Discharge: 2016-03-05 | Disposition: A | Discharge status: Home / Self Care | Payer: Medicare Other | Source: Ambulatory Visit | Attending: Neurology | Admitting: Neurology

## 2016-03-05 DIAGNOSIS — M4802 Spinal stenosis, cervical region: Secondary | ICD-10-CM | POA: Diagnosis not present

## 2016-03-05 DIAGNOSIS — I6522 Occlusion and stenosis of left carotid artery: Secondary | ICD-10-CM | POA: Diagnosis not present

## 2016-03-05 DIAGNOSIS — M5410 Radiculopathy, site unspecified: Secondary | ICD-10-CM | POA: Diagnosis not present

## 2016-03-05 DIAGNOSIS — M4322 Fusion of spine, cervical region: Secondary | ICD-10-CM | POA: Diagnosis not present

## 2016-03-05 MED ORDER — GADOBENATE DIMEGLUMINE 529 MG/ML IV SOLN
15.0000 mL | Freq: Once | INTRAVENOUS | Status: AC | PRN
  Administered 2016-03-05: 15 mL via INTRAVENOUS

## 2016-03-09 ENCOUNTER — Ambulatory Visit: Payer: Medicare Other | Admitting: Physical Therapy

## 2016-03-11 ENCOUNTER — Ambulatory Visit: Payer: Medicare Other | Admitting: Physical Therapy

## 2016-03-16 ENCOUNTER — Ambulatory Visit: Payer: Medicare Other | Admitting: Physical Therapy

## 2016-03-18 ENCOUNTER — Ambulatory Visit: Payer: Medicare Other | Admitting: Physical Therapy

## 2016-03-23 ENCOUNTER — Ambulatory Visit: Payer: Medicare Other | Admitting: Physical Therapy

## 2016-03-25 ENCOUNTER — Ambulatory Visit: Payer: Medicare Other | Admitting: Physical Therapy

## 2016-03-31 ENCOUNTER — Ambulatory Visit: Payer: Medicare Other | Admitting: Physical Therapy

## 2016-04-02 ENCOUNTER — Ambulatory Visit: Payer: Medicare Other | Admitting: Physical Therapy

## 2016-04-07 ENCOUNTER — Ambulatory Visit: Payer: Medicare Other | Admitting: Physical Therapy

## 2016-04-07 DIAGNOSIS — R251 Tremor, unspecified: Secondary | ICD-10-CM | POA: Diagnosis not present

## 2016-04-07 DIAGNOSIS — R2689 Other abnormalities of gait and mobility: Secondary | ICD-10-CM | POA: Diagnosis not present

## 2016-04-09 ENCOUNTER — Ambulatory Visit: Payer: Medicare Other | Admitting: Physical Therapy

## 2016-04-13 ENCOUNTER — Ambulatory Visit: Payer: Medicare Other | Admitting: Physical Therapy

## 2016-04-15 ENCOUNTER — Ambulatory Visit: Payer: Medicare Other | Admitting: Physical Therapy

## 2016-04-20 ENCOUNTER — Ambulatory Visit: Payer: Medicare Other | Admitting: Physical Therapy

## 2016-04-21 DIAGNOSIS — M4802 Spinal stenosis, cervical region: Secondary | ICD-10-CM | POA: Diagnosis not present

## 2016-04-22 ENCOUNTER — Ambulatory Visit: Payer: Medicare Other | Admitting: Physical Therapy

## 2016-04-28 DIAGNOSIS — M79674 Pain in right toe(s): Secondary | ICD-10-CM | POA: Diagnosis not present

## 2016-04-28 DIAGNOSIS — M79675 Pain in left toe(s): Secondary | ICD-10-CM | POA: Diagnosis not present

## 2016-04-28 DIAGNOSIS — B351 Tinea unguium: Secondary | ICD-10-CM | POA: Diagnosis not present

## 2016-05-05 ENCOUNTER — Ambulatory Visit: Payer: Medicare Other | Admitting: Physical Therapy

## 2016-05-07 ENCOUNTER — Ambulatory Visit: Payer: Medicare Other | Admitting: Physical Therapy

## 2016-05-12 ENCOUNTER — Ambulatory Visit: Payer: Medicare Other | Admitting: Physical Therapy

## 2016-05-14 ENCOUNTER — Ambulatory Visit: Payer: Medicare Other | Admitting: Physical Therapy

## 2016-05-19 ENCOUNTER — Ambulatory Visit: Payer: Medicare Other | Admitting: Physical Therapy

## 2016-05-20 ENCOUNTER — Ambulatory Visit: Payer: Medicare Other

## 2016-05-21 ENCOUNTER — Ambulatory Visit: Payer: Medicare Other | Admitting: Physical Therapy

## 2016-05-25 ENCOUNTER — Ambulatory Visit: Payer: Medicare Other | Attending: Neurology

## 2016-05-25 VITALS — BP 131/73 | HR 57

## 2016-05-25 DIAGNOSIS — R2681 Unsteadiness on feet: Secondary | ICD-10-CM

## 2016-05-25 DIAGNOSIS — Z9181 History of falling: Secondary | ICD-10-CM
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
## 2016-05-25 NOTE — Patient Instructions (Signed)
Feet Heel-Toe "Tandem", Head Motion - Eyes Open    Exercise 1: With eyes open stand with your right foot directly in front of the other. Close your eyes and hold for a total of 30 seconds. Then switch the position of your feet so that your left foot is in front. Close your eyes for a total of 30 seconds. Complete 3 times with each leg forward   Exercise 2: With eyes open, right foot directly in front of the other, move head slowly: left and right. Perform for 30 seconds and then switch the position of your feet so that your left foot is in front. Perform for 30 seconds. Complete 3 times with each leg forward       Exercise 3: SINGLE LEG STANCE: Stand on one leg and maintain your balance. Perform near the back of a chair/counter for safety. Perform for a total of 30 seconds. Then switch feet and perform for another 30 seconds. Complete 3 times on each leg.

## 2016-05-26 ENCOUNTER — Ambulatory Visit: Payer: Medicare Other

## 2016-05-26 ENCOUNTER — Ambulatory Visit: Payer: Medicare Other | Admitting: Physical Therapy

## 2016-05-27 ENCOUNTER — Ambulatory Visit: Payer: Medicare Other | Admitting: Physical Therapy

## 2016-05-28 ENCOUNTER — Ambulatory Visit: Payer: Medicare Other

## 2016-06-02 ENCOUNTER — Ambulatory Visit: Payer: Medicare Other | Admitting: Physical Therapy

## 2016-06-03 ENCOUNTER — Ambulatory Visit: Payer: Medicare Other

## 2016-06-04 ENCOUNTER — Ambulatory Visit: Payer: Medicare Other | Admitting: Physical Therapy

## 2016-06-08 ENCOUNTER — Ambulatory Visit: Payer: Medicare Other

## 2016-06-09 ENCOUNTER — Ambulatory Visit: Payer: Medicare Other | Admitting: Physical Therapy

## 2016-06-10 ENCOUNTER — Encounter: Payer: Self-pay | Admitting: Physical Therapy

## 2016-06-10 ENCOUNTER — Ambulatory Visit: Payer: Medicare Other

## 2016-06-10 ENCOUNTER — Ambulatory Visit: Payer: Medicare Other | Attending: Neurology | Admitting: Physical Therapy

## 2016-06-10 DIAGNOSIS — Z9181 History of falling: Secondary | ICD-10-CM | POA: Diagnosis not present

## 2016-06-10 DIAGNOSIS — R2681 Unsteadiness on feet: Secondary | ICD-10-CM | POA: Diagnosis not present
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
## 2016-06-11 ENCOUNTER — Ambulatory Visit: Payer: Medicare Other | Admitting: Physical Therapy

## 2016-06-12 ENCOUNTER — Ambulatory Visit: Payer: Medicare Other

## 2016-06-15 ENCOUNTER — Ambulatory Visit: Payer: Medicare Other

## 2016-06-15 ENCOUNTER — Ambulatory Visit: Payer: Medicare Other | Admitting: Physical Therapy

## 2016-06-16 ENCOUNTER — Ambulatory Visit: Payer: Medicare Other | Admitting: Physical Therapy

## 2016-06-17 ENCOUNTER — Ambulatory Visit: Payer: Medicare Other | Admitting: Physical Therapy

## 2016-06-17 ENCOUNTER — Ambulatory Visit: Payer: Medicare Other

## 2016-06-17 DIAGNOSIS — R2681 Unsteadiness on feet: Secondary | ICD-10-CM | POA: Diagnosis not present

## 2016-06-17 DIAGNOSIS — Z9181 History of falling: Secondary | ICD-10-CM | POA: Diagnosis not present
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
## 2016-06-18 ENCOUNTER — Ambulatory Visit: Payer: Medicare Other | Admitting: Physical Therapy

## 2016-06-22 ENCOUNTER — Ambulatory Visit: Payer: Medicare Other

## 2016-06-22 ENCOUNTER — Ambulatory Visit: Payer: Medicare Other | Admitting: Physical Therapy

## 2016-06-24 ENCOUNTER — Ambulatory Visit: Payer: Medicare Other | Admitting: Physical Therapy

## 2016-06-24 ENCOUNTER — Ambulatory Visit: Payer: Medicare Other

## 2016-06-29 ENCOUNTER — Ambulatory Visit: Payer: Medicare Other | Admitting: Physical Therapy

## 2016-06-29 ENCOUNTER — Ambulatory Visit: Payer: Medicare Other

## 2016-06-30 ENCOUNTER — Encounter: Payer: Medicare Other | Admitting: Family Medicine

## 2016-06-30 ENCOUNTER — Ambulatory Visit: Payer: PRIVATE HEALTH INSURANCE

## 2016-07-01 ENCOUNTER — Encounter: Payer: Self-pay | Admitting: Physical Therapy

## 2016-07-01 ENCOUNTER — Ambulatory Visit: Payer: Medicare Other

## 2016-07-01 ENCOUNTER — Ambulatory Visit: Payer: Medicare Other | Admitting: Physical Therapy

## 2016-07-01 DIAGNOSIS — R2681 Unsteadiness on feet: Secondary | ICD-10-CM | POA: Diagnosis not present

## 2016-07-01 DIAGNOSIS — Z9181 History of falling: Secondary | ICD-10-CM | POA: Diagnosis not present
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
## 2016-07-15 ENCOUNTER — Ambulatory Visit: Payer: Medicare Other | Attending: Neurology | Admitting: Physical Therapy

## 2016-07-15 DIAGNOSIS — Z9181 History of falling: Secondary | ICD-10-CM | POA: Insufficient documentation

## 2016-07-15 DIAGNOSIS — R2681 Unsteadiness on feet: Secondary | ICD-10-CM | POA: Diagnosis not present
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
## 2016-07-22 ENCOUNTER — Encounter: Payer: Medicare Other | Admitting: Family Medicine

## 2016-07-22 ENCOUNTER — Ambulatory Visit: Payer: Medicare Other

## 2016-07-22 ENCOUNTER — Ambulatory Visit: Payer: Medicare Other | Admitting: Physical Therapy

## 2016-07-29 ENCOUNTER — Encounter: Payer: Self-pay | Admitting: Physical Therapy

## 2016-07-29 ENCOUNTER — Ambulatory Visit: Payer: Medicare Other | Admitting: Physical Therapy

## 2016-07-29 VITALS — BP 174/59 | HR 55

## 2016-07-29 DIAGNOSIS — R2681 Unsteadiness on feet: Secondary | ICD-10-CM | POA: Diagnosis not present

## 2016-07-29 DIAGNOSIS — Z9181 History of falling: Secondary | ICD-10-CM | POA: Diagnosis not present
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
## 2016-07-30 ENCOUNTER — Encounter: Payer: Medicare Other | Admitting: Family Medicine

## 2016-08-05 ENCOUNTER — Ambulatory Visit: Payer: Medicare Other | Admitting: Physical Therapy

## 2016-08-12 ENCOUNTER — Encounter: Payer: Self-pay | Admitting: Physical Therapy

## 2016-08-12 ENCOUNTER — Ambulatory Visit: Payer: Medicare Other | Attending: Neurology | Admitting: Physical Therapy

## 2016-08-12 DIAGNOSIS — R2681 Unsteadiness on feet: Secondary | ICD-10-CM | POA: Diagnosis not present

## 2016-08-12 DIAGNOSIS — Z9181 History of falling: Secondary | ICD-10-CM | POA: Diagnosis not present
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
## 2016-08-18 ENCOUNTER — Ambulatory Visit: Payer: Medicare Other

## 2016-08-19 ENCOUNTER — Ambulatory Visit: Payer: Medicare Other

## 2016-08-19 ENCOUNTER — Ambulatory Visit: Payer: Medicare Other | Admitting: Physical Therapy

## 2016-08-19 ENCOUNTER — Encounter: Payer: Self-pay | Admitting: Physical Therapy

## 2016-08-19 DIAGNOSIS — R2681 Unsteadiness on feet: Secondary | ICD-10-CM | POA: Diagnosis not present

## 2016-08-19 DIAGNOSIS — Z9181 History of falling: Secondary | ICD-10-CM
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
## 2016-08-26 ENCOUNTER — Ambulatory Visit: Payer: Medicare Other | Admitting: Physical Therapy

## 2016-09-02 ENCOUNTER — Encounter: Payer: Medicare Other | Admitting: Family Medicine

## 2016-09-02 ENCOUNTER — Ambulatory Visit: Payer: Medicare Other

## 2016-09-03 ENCOUNTER — Ambulatory Visit: Payer: Medicare Other | Admitting: Physical Therapy

## 2016-09-10 ENCOUNTER — Ambulatory Visit: Payer: Medicare Other | Admitting: Physical Therapy

## 2016-09-16 ENCOUNTER — Ambulatory Visit (INDEPENDENT_AMBULATORY_CARE_PROVIDER_SITE_OTHER): Payer: Medicare Other | Admitting: Family Medicine

## 2016-09-16 ENCOUNTER — Ambulatory Visit (INDEPENDENT_AMBULATORY_CARE_PROVIDER_SITE_OTHER): Payer: Medicare Other

## 2016-09-16 ENCOUNTER — Encounter: Payer: Self-pay | Admitting: Family Medicine

## 2016-09-16 VITALS — BP 104/64 | HR 60 | Temp 97.8°F | Ht 70.0 in | Wt 175.0 lb

## 2016-09-16 VITALS — BP 104/64 | HR 60 | Temp 97.8°F | Ht 70.0 in | Wt 175.8 lb

## 2016-09-16 DIAGNOSIS — R251 Tremor, unspecified: Secondary | ICD-10-CM | POA: Diagnosis not present

## 2016-09-16 DIAGNOSIS — F419 Anxiety disorder, unspecified: Secondary | ICD-10-CM

## 2016-09-16 DIAGNOSIS — I10 Essential (primary) hypertension: Secondary | ICD-10-CM

## 2016-09-16 DIAGNOSIS — R5383 Other fatigue: Secondary | ICD-10-CM

## 2016-09-16 DIAGNOSIS — Z0000 Encounter for general adult medical examination without abnormal findings: Secondary | ICD-10-CM | POA: Diagnosis not present

## 2016-09-16 DIAGNOSIS — Z1211 Encounter for screening for malignant neoplasm of colon: Secondary | ICD-10-CM | POA: Diagnosis not present

## 2016-09-16 NOTE — Progress Notes (Signed)
Patient: William Khan Male    DOB: 1940/03/18   76 y.o.   MRN: [id number] Visit Date: 09/16/2016  Today's Provider: Wilhemena Durie, MD   Chief Complaint  Patient presents with  . Hypertension  . Depression   Subjective:    HPI   Colonoscopy- 04/25/08 internal hemorrhoids.   Immunization History  Administered Date(s) Administered  . Influenza, High Dose Seasonal PF 11/14/2015  . Influenza-Unspecified 10/05/2012  . Pneumococcal Conjugate-13 12/04/2013  . Pneumococcal Polysaccharide-23 10/14/2006  . Td 03/10/2007       Hypertension, follow-up:  BP Readings from Last 3 Encounters:  09/16/16 104/64  09/16/16 104/64  07/29/16 (!) 174/59    He was last seen for hypertension 1 years ago.  BP at that visit was 174/59. Management since that visit includes none. He reports good compliance with treatment. He is not having side effects.  He is exercising. He is adherent to low salt diet.   Outside blood pressures are 100/60-70's. Patient denies chest pain, chest pressure/discomfort, claudication, dyspnea, exertional chest pressure/discomfort, fatigue, irregular heart beat, lower extremity edema, near-syncope, orthopnea, palpitations, paroxysmal nocturnal dyspnea, syncope and tachypnea.   Cardiovascular risk factors include advanced age (older than 68 for men, 24 for women).   Wt Readings from Last 3 Encounters:  09/16/16 175 lb (79.4 kg)  09/16/16 175 lb 12.8 oz (79.7 kg)  12/10/14 177 lb (80.3 kg)   ------------------------------------------------------------------------    Anxiety/depression- pt reports that he is feeling well emotionally.    Allergies  Allergen Reactions  . Ace Inhibitors Swelling     Current Outpatient Prescriptions:  .  busPIRone (BUSPAR) 30 MG tablet, Take 30 mg by mouth 2 (two) times daily., Disp: , Rfl:  .  Cholecalciferol (VITAMIN D3) 1000 UNITS CAPS, Take 1,000 Units by mouth daily. , Disp: , Rfl:  .  DULoxetine (CYMBALTA) 30  MG capsule, Take 30 mg by mouth daily. , Disp: , Rfl:  .  fluticasone (FLONASE) 50 MCG/ACT nasal spray, USE TWO SPRAY(S) IN EACH NOSTRIL ONCE DAILY (Patient taking differently: USE TWO SPRAY(S) IN EACH NOSTRIL ONCE DAILY as needed), Disp: 16 g, Rfl: 12 .  loratadine (CLARITIN) 10 MG tablet, TAKE ONE TABLET BY MOUTH ONCE DAILY AS NEEDED FOR  ITCHING  RASH, Disp: 30 tablet, Rfl: 12 .  LORazepam (ATIVAN) 0.5 MG tablet, Take 0.5 mg by mouth as needed. , Disp: , Rfl:  .  mirtazapine (REMERON) 30 MG tablet, Take 30 mg by mouth at bedtime. , Disp: , Rfl:  .  PARoxetine (PAXIL) 30 MG tablet, , Disp: , Rfl:  .  pravastatin (PRAVACHOL) 40 MG tablet, Take 40 mg by mouth daily. , Disp: , Rfl:  .  propranolol (INDERAL) 20 MG tablet, Take 20 mg by mouth 2 (two) times daily., Disp: , Rfl:  .  QUEtiapine (SEROQUEL) 50 MG tablet, , Disp: , Rfl:  .  risperiDONE (RISPERDAL) 1 MG tablet, , Disp: , Rfl:   Review of Systems  Constitutional: Negative.   HENT: Negative.   Eyes: Negative.   Respiratory: Negative.   Cardiovascular: Negative.   Gastrointestinal: Positive for anal bleeding and constipation.  Endocrine: Negative.   Genitourinary: Positive for enuresis.  Musculoskeletal: Negative.   Skin: Negative.   Allergic/Immunologic: Negative.   Neurological: Positive for tremors.  Hematological: Bruises/bleeds easily.  Psychiatric/Behavioral: The patient is nervous/anxious.     Social History  Substance Use Topics  . Smoking status: Former Smoker    Years: 20.00  Quit date: 01/05/1990  . Smokeless tobacco: Never Used  . Alcohol use No   Objective:   BP 104/64   Pulse 60   Temp 97.8 F (36.6 C) (Oral)   Ht 5\' 10"  (1.778 m)   Wt 175 lb (79.4 kg)   BMI 25.11 kg/m  Vitals:   09/16/16 1339  BP: 104/64  Pulse: 60  Temp: 97.8 F (36.6 C)  TempSrc: Oral  Weight: 175 lb (79.4 kg)  Height: 5\' 10"  (1.778 m)     Physical Exam  Constitutional: He is oriented to person, place, and time. He  appears well-developed and well-nourished.  HENT:  Head: Normocephalic and atraumatic.  Eyes: Conjunctivae are normal. No scleral icterus.  Right pupil chronically dilated.  Neck: No thyromegaly present.  Cardiovascular: Normal rate, regular rhythm and normal heart sounds.   Pulmonary/Chest: Effort normal and breath sounds normal.  Abdominal: Soft.  Neurological: He is alert and oriented to person, place, and time.  Skin: Skin is warm and dry.  Psychiatric: He has a normal mood and affect. His behavior is normal. Judgment and thought content normal.        Assessment & Plan:     1. Essential (primary) hypertension  - CBC with Differential/Platelet - POCT urinalysis dipstick  2. Anxiety  - TSH  3. Tremor Per Dr Melrose Nakayama.  4. Other fatigue  - Comprehensive metabolic panel  5. Screening for colon cancer  - Cologuard 6.Chronic Anxiety Still an issue.     I have done the exam and reviewed the above chart and it is accurate to the best of my knowledge. Development worker, community has been used in this note in any air is in the dictation or transcription are unintentional.  Wilhemena Durie, MD  [place]

## 2016-09-16 NOTE — Patient Instructions (Addendum)
William Khan , Thank you for taking time to come for your Medicare Wellness Visit. I appreciate your ongoing commitment to your health goals. Please review the following plan we discussed and let me know if I can assist you in the future.   Screening recommendations/referrals: Colonoscopy: up to date Recommended yearly ophthalmology/optometry visit for glaucoma screening and checkup Recommended yearly dental visit for hygiene and checkup  Vaccinations: Influenza vaccine: declined Pneumococcal vaccine: completed series Tdap vaccine: up to date, due 03/2017 Shingles vaccine: completed (per patient)  Advanced directives: Advance directive discussed with you today. Even though you declined this today please call our office should you change your mind and we can give you the proper paperwork for you to fill out.  Conditions/risks identified: Fall risk prevention; Recommend increasing water intake to 4-6 glasses a day.  Next appointment: 2:00 PM  Preventive Care 78 Years and Older, Male Preventive care refers to lifestyle choices and visits with your health care provider that can promote health and wellness. What does preventive care include?  A yearly physical exam. This is also called an annual well check.  Dental exams once or twice a year.  Routine eye exams. Ask your health care provider how often you should have your eyes checked.  Personal lifestyle choices, including:  Daily care of your teeth and gums.  Regular physical activity.  Eating a healthy diet.  Avoiding tobacco and drug use.  Limiting alcohol use.  Practicing safe sex.  Taking low doses of aspirin every day.  Taking vitamin and mineral supplements as recommended by your health care provider. What happens during an annual well check? The services and screenings done by your health care provider during your annual well check will depend on your age, overall health, lifestyle risk factors, and family history of  disease. Counseling  Your health care provider may ask you questions about your:  Alcohol use.  Tobacco use.  Drug use.  Emotional well-being.  Home and relationship well-being.  Sexual activity.  Eating habits.  History of falls.  Memory and ability to understand (cognition).  Work and work Statistician. Screening  You may have the following tests or measurements:  Height, weight, and BMI.  Blood pressure.  Lipid and cholesterol levels. These may be checked every 5 years, or more frequently if you are over 45 years old.  Skin check.  Lung cancer screening. You may have this screening every year starting at age 31 if you have a 30-pack-year history of smoking and currently smoke or have quit within the past 15 years.  Fecal occult blood test (FOBT) of the stool. You may have this test every year starting at age 33.  Flexible sigmoidoscopy or colonoscopy. You may have a sigmoidoscopy every 5 years or a colonoscopy every 10 years starting at age 27.  Prostate cancer screening. Recommendations will vary depending on your family history and other risks.  Hepatitis C blood test.  Hepatitis B blood test.  Sexually transmitted disease (STD) testing.  Diabetes screening. This is done by checking your blood sugar (glucose) after you have not eaten for a while (fasting). You may have this done every 1-3 years.  Abdominal aortic aneurysm (AAA) screening. You may need this if you are a current or former smoker.  Osteoporosis. You may be screened starting at age 33 if you are at high risk. Talk with your health care provider about your test results, treatment options, and if necessary, the need for more tests. Vaccines  Your health  care provider may recommend certain vaccines, such as:  Influenza vaccine. This is recommended every year.  Tetanus, diphtheria, and acellular pertussis (Tdap, Td) vaccine. You may need a Td booster every 10 years.  Zoster vaccine. You may  need this after age 8.  Pneumococcal 13-valent conjugate (PCV13) vaccine. One dose is recommended after age 66.  Pneumococcal polysaccharide (PPSV23) vaccine. One dose is recommended after age 40. Talk to your health care provider about which screenings and vaccines you need and how often you need them. This information is not intended to replace advice given to you by your health care provider. Make sure you discuss any questions you have with your health care provider. Document Released: 01/18/2015 Document Revised: 09/11/2015 Document Reviewed: 10/23/2014 Elsevier Interactive Patient Education  2017 [place] Prevention in the Home Falls can cause injuries. They can happen to people of all ages. There are many things you can do to make your home safe and to help prevent falls. What can I do on the outside of my home?  Regularly fix the edges of walkways and driveways and fix any cracks.  Remove anything that might make you trip as you walk through a door, such as a raised step or threshold.  Trim any bushes or trees on the path to your home.  Use bright outdoor lighting.  Clear any walking paths of anything that might make someone trip, such as rocks or tools.  Regularly check to see if handrails are loose or broken. Make sure that both sides of any steps have handrails.  Any raised decks and porches should have guardrails on the edges.  Have any leaves, snow, or ice cleared regularly.  Use sand or salt on walking paths during winter.  Clean up any spills in your garage right away. This includes oil or grease spills. What can I do in the bathroom?  Use night lights.  Install grab bars by the toilet and in the tub and shower. Do not use towel bars as grab bars.  Use non-skid mats or decals in the tub or shower.  If you need to sit down in the shower, use a plastic, non-slip stool.  Keep the floor dry. Clean up any water that spills on the floor as soon as it  happens.  Remove soap buildup in the tub or shower regularly.  Attach bath mats securely with double-sided non-slip rug tape.  Do not have throw rugs and other things on the floor that can make you trip. What can I do in the bedroom?  Use night lights.  Make sure that you have a light by your bed that is easy to reach.  Do not use any sheets or blankets that are too big for your bed. They should not hang down onto the floor.  Have a firm chair that has side arms. You can use this for support while you get dressed.  Do not have throw rugs and other things on the floor that can make you trip. What can I do in the kitchen?  Clean up any spills right away.  Avoid walking on wet floors.  Keep items that you use a lot in easy-to-reach places.  If you need to reach something above you, use a strong step stool that has a grab bar.  Keep electrical cords out of the way.  Do not use floor polish or wax that makes floors slippery. If you must use wax, use non-skid floor wax.  Do not have  throw rugs and other things on the floor that can make you trip. What can I do with my stairs?  Do not leave any items on the stairs.  Make sure that there are handrails on both sides of the stairs and use them. Fix handrails that are broken or loose. Make sure that handrails are as long as the stairways.  Check any carpeting to make sure that it is firmly attached to the stairs. Fix any carpet that is loose or worn.  Avoid having throw rugs at the top or bottom of the stairs. If you do have throw rugs, attach them to the floor with carpet tape.  Make sure that you have a light switch at the top of the stairs and the bottom of the stairs. If you do not have them, ask someone to add them for you. What else can I do to help prevent falls?  Wear shoes that:  Do not have high heels.  Have rubber bottoms.  Are comfortable and fit you well.  Are closed at the toe. Do not wear sandals.  If you  use a stepladder:  Make sure that it is fully opened. Do not climb a closed stepladder.  Make sure that both sides of the stepladder are locked into place.  Ask someone to hold it for you, if possible.  Clearly mark and make sure that you can see:  Any grab bars or handrails.  First and last steps.  Where the edge of each step is.  Use tools that help you move around (mobility aids) if they are needed. These include:  Canes.  Walkers.  Scooters.  Crutches.  Turn on the lights when you go into a dark area. Replace any light bulbs as soon as they burn out.  Set up your furniture so you have a clear path. Avoid moving your furniture around.  If any of your floors are uneven, fix them.  If there are any pets around you, be aware of where they are.  Review your medicines with your doctor. Some medicines can make you feel dizzy. This can increase your chance of falling. Ask your doctor what other things that you can do to help prevent falls. This information is not intended to replace advice given to you by your health care provider. Make sure you discuss any questions you have with your health care provider. Document Released: 10/18/2008 Document Revised: 05/30/2015 Document Reviewed: 01/26/2014 Elsevier Interactive Patient Education  2017 [employer].

## 2016-09-16 NOTE — Progress Notes (Signed)
Subjective:   William Khan is a 76 y.o. male who presents for Medicare Annual/Subsequent preventive examination.  Review of Systems:  N/A  Cardiac Risk Factors include: advanced age (>81men, >66 women);dyslipidemia;hypertension;male gender     Objective:    Vitals: BP 104/64 (BP Location: Left Arm)   Pulse 60   Temp 97.8 F (36.6 C) (Oral)   Ht 5\' 10"  (1.778 m)   Wt 175 lb 12.8 oz (79.7 kg)   BMI 25.22 kg/m   Body mass index is 25.22 kg/m.  Tobacco History  Smoking Status  . Former Smoker  . Years: 20.00  . Quit date: 01/05/1990  Smokeless Tobacco  . Never Used     Counseling given: Not Answered   History reviewed. No pertinent past medical history. Past Surgical History:  Procedure Laterality Date  . SHOULDER SURGERY     Family History  Problem Relation Age of Onset  . Arthritis Mother   . Hypertension Mother   . Alzheimer's disease Mother    History  Sexual Activity  . Sexual activity: Not on file    Outpatient Encounter Prescriptions as of 09/16/2016  Medication Sig  . busPIRone (BUSPAR) 30 MG tablet Take 30 mg by mouth 2 (two) times daily.  . Cholecalciferol (VITAMIN D3) 1000 UNITS CAPS Take 1,000 Units by mouth daily.   . DULoxetine (CYMBALTA) 30 MG capsule Take 30 mg by mouth daily.   . fluticasone (FLONASE) 50 MCG/ACT nasal spray USE TWO SPRAY(S) IN EACH NOSTRIL ONCE DAILY (Patient taking differently: USE TWO SPRAY(S) IN EACH NOSTRIL ONCE DAILY as needed)  . loratadine (CLARITIN) 10 MG tablet TAKE ONE TABLET BY MOUTH ONCE DAILY AS NEEDED FOR  ITCHING  RASH  . LORazepam (ATIVAN) 0.5 MG tablet Take 0.5 mg by mouth as needed.   . pravastatin (PRAVACHOL) 40 MG tablet Take 40 mg by mouth daily.   . propranolol (INDERAL) 20 MG tablet Take 20 mg by mouth 2 (two) times daily.  . mirtazapine (REMERON) 30 MG tablet Take 30 mg by mouth at bedtime.   Marland Kitchen PARoxetine (PAXIL) 30 MG tablet   . QUEtiapine (SEROQUEL) 50 MG tablet   . risperiDONE (RISPERDAL) 1 MG  tablet    No facility-administered encounter medications on file as of 09/16/2016.     Activities of Daily Living In your present state of health, do you have any difficulty performing the following activities: 09/16/2016  Hearing? N  Vision? N  Difficulty concentrating or making decisions? N  Walking or climbing stairs? N  Dressing or bathing? N  Doing errands, shopping? N  Preparing Food and eating ? N  Using the Toilet? N  In the past six months, have you accidently leaked urine? Y  Comment pt is not following up on this issues with a doctor  Do you have problems with loss of bowel control? N  Managing your Medications? N  Managing your Finances? N  Housekeeping or managing your Housekeeping? N  Some recent data might be hidden    Patient Care Team: Jerrol Banana., MD as PCP - General (Family Medicine) Anabel Bene, MD as Referring Physician (Neurology)   Assessment:     Exercise Activities and Dietary recommendations Current Exercise Habits: Home exercise routine, Type of exercise: stretching;treadmill;walking, Time (Minutes): 30, Frequency (Times/Week): 3, Weekly Exercise (Minutes/Week): 90, Intensity: Mild  Goals    . Increase water intake          Recommend increasing water intake to 4-6 glasses a  day.      Fall Risk Fall Risk  09/16/2016 06/21/2014  Falls in the past year? Yes No  Number falls in past yr: 2 or more -  Injury with Fall? No -  Follow up Falls prevention discussed -   Depression Screen PHQ 2/9 Scores 09/16/2016 06/21/2014  PHQ - 2 Score 0 0    Cognitive Function     6CIT Screen 09/16/2016  What Year? 0 points  What month? 0 points  What time? 0 points  Count back from 20 0 points  Months in reverse 0 points  Repeat phrase 6 points  Total Score 6    Immunization History  Administered Date(s) Administered  . Influenza, High Dose Seasonal PF 11/14/2015  . Influenza-Unspecified 10/05/2012  . Pneumococcal Conjugate-13  12/04/2013  . Pneumococcal Polysaccharide-23 10/14/2006  . Td 03/10/2007   Screening Tests Health Maintenance  Topic Date Due  . INFLUENZA VACCINE  08/05/2016  . TETANUS/TDAP  03/09/2017  . PNA vac Low Risk Adult  Completed      Plan:  I have personally reviewed and addressed the Medicare Annual Wellness questionnaire and have noted the following in the patient's chart:  A. Medical and social history B. Use of alcohol, tobacco or illicit drugs  C. Current medications and supplements D. Functional ability and status E.  Nutritional status F.  Physical activity G. Advance directives H. List of other physicians I.  Hospitalizations, surgeries, and ER visits in previous 12 months J.  [place] such as hearing and vision if needed, cognitive and depression L. Referrals and appointments - none  In addition, I have reviewed and discussed with patient certain preventive protocols, quality metrics, and best practice recommendations. A written personalized care plan for preventive services as well as general preventive health recommendations were provided to patient.  See attached scanned questionnaire for additional information.   Signed,  Fabio Neighbors, LPN Nurse Health Advisor   MD Recommendations: None. Pt declined influenza vaccine today. States he will receive this at pharmacy.

## 2016-09-17 ENCOUNTER — Ambulatory Visit: Payer: Medicare Other | Admitting: Physical Therapy

## 2016-09-17 ENCOUNTER — Telehealth: Payer: Self-pay | Admitting: Family Medicine

## 2016-09-17 NOTE — Telephone Encounter (Signed)
Order for cologuard faxed to [facility] °

## 2016-09-18 LAB — COMPLETE METABOLIC PANEL WITH GFR
AG Ratio: 1.5 (calc) (ref 1.0–2.5)
ALKALINE PHOSPHATASE (APISO): 71 U/L (ref 40–115)
ALT: 18 U/L (ref 9–46)
AST: 20 U/L (ref 10–35)
Albumin: 4.1 g/dL (ref 3.6–5.1)
BILIRUBIN TOTAL: 0.5 mg/dL (ref 0.2–1.2)
BUN: 15 mg/dL (ref 7–25)
CHLORIDE: 104 mmol/L (ref 98–110)
CO2: 27 mmol/L (ref 20–32)
CREATININE: 1.08 mg/dL (ref 0.70–1.18)
Calcium: 9.3 mg/dL (ref 8.6–10.3)
GFR, Est African American: 76 mL/min/{1.73_m2} (ref >=60)
GFR, Est Non African American: 65 mL/min/{1.73_m2} (ref >=60)
GLOBULIN: 2.7 g/dL (ref 1.9–3.7)
Glucose, Bld: 81 mg/dL (ref 65–99)
POTASSIUM: 4.5 mmol/L (ref 3.5–5.3)
Sodium: 138 mmol/L (ref 135–146)
Total Protein: 6.8 g/dL (ref 6.1–8.1)

## 2016-09-18 LAB — CBC WITH DIFFERENTIAL/PLATELET

## 2016-09-18 LAB — TSH: TSH: 2.76 mIU/L (ref 0.40–4.50)

## 2016-09-21 LAB — POCT URINALYSIS DIPSTICK
Bilirubin, UA: NEGATIVE
Blood, UA: NEGATIVE
GLUCOSE UA: NEGATIVE
Ketones, UA: NEGATIVE
LEUKOCYTES UA: NEGATIVE
Nitrite, UA: NEGATIVE
Protein, UA: NEGATIVE
SPEC GRAV UA: 1.015 (ref 1.010–1.025)
UROBILINOGEN UA: 0.2 U/dL
pH, UA: 6 (ref 5.0–8.0)

## 2016-09-24 ENCOUNTER — Ambulatory Visit: Payer: Medicare Other | Admitting: Physical Therapy

## 2016-09-30 ENCOUNTER — Encounter: Payer: Medicare Other | Admitting: Physical Therapy

## 2016-10-22 DIAGNOSIS — Z23 Encounter for immunization: Secondary | ICD-10-CM | POA: Diagnosis not present

## 2016-10-29 ENCOUNTER — Telehealth: Payer: Self-pay

## 2016-10-29 NOTE — Telephone Encounter (Signed)
Patient called requesting a refill on Trazodone 50mg . He reports that it helps him sleep. He has not been on this medication for a while and was hoping to start back on it. Ok to send in? Please advise. Thanks!

## 2016-10-30 MED ORDER — TRAZODONE HCL 50 MG PO TABS: 50.0000 mg | ORAL_TABLET | Freq: Every evening | ORAL | 12 refills | Status: DC | PRN

## 2016-10-30 NOTE — Telephone Encounter (Signed)
rx sent in-Anastasiya V Hopkins, RMA  

## 2016-10-30 NOTE — Telephone Encounter (Signed)
ok 

## 2016-11-09 DIAGNOSIS — R251 Tremor, unspecified: Secondary | ICD-10-CM | POA: Diagnosis not present

## 2016-11-09 DIAGNOSIS — M4800 Spinal stenosis, site unspecified: Secondary | ICD-10-CM | POA: Diagnosis not present

## 2016-11-09 DIAGNOSIS — M5410 Radiculopathy, site unspecified: Secondary | ICD-10-CM | POA: Insufficient documentation

## 2016-11-09 DIAGNOSIS — F411 Generalized anxiety disorder: Secondary | ICD-10-CM | POA: Diagnosis not present

## 2016-11-09 DIAGNOSIS — G4700 Insomnia, unspecified: Secondary | ICD-10-CM | POA: Insufficient documentation

## 2016-11-17 DIAGNOSIS — M4800 Spinal stenosis, site unspecified: Secondary | ICD-10-CM | POA: Insufficient documentation

## 2016-12-03 DIAGNOSIS — R2689 Other abnormalities of gait and mobility: Secondary | ICD-10-CM | POA: Diagnosis not present

## 2016-12-08 DIAGNOSIS — Z1212 Encounter for screening for malignant neoplasm of rectum: Secondary | ICD-10-CM | POA: Diagnosis not present

## 2016-12-08 DIAGNOSIS — Z1211 Encounter for screening for malignant neoplasm of colon: Secondary | ICD-10-CM | POA: Diagnosis not present

## 2016-12-16 DIAGNOSIS — G479 Sleep disorder, unspecified: Secondary | ICD-10-CM | POA: Diagnosis not present

## 2016-12-16 DIAGNOSIS — F419 Anxiety disorder, unspecified: Secondary | ICD-10-CM | POA: Diagnosis not present

## 2016-12-16 DIAGNOSIS — R251 Tremor, unspecified: Secondary | ICD-10-CM | POA: Diagnosis not present

## 2016-12-18 LAB — COLOGUARD: Cologuard: NEGATIVE

## 2017-01-13 ENCOUNTER — Other Ambulatory Visit: Payer: Self-pay | Admitting: Family Medicine

## 2017-01-18 ENCOUNTER — Encounter: Payer: Self-pay | Admitting: Family Medicine

## 2017-02-16 DIAGNOSIS — F419 Anxiety disorder, unspecified: Secondary | ICD-10-CM | POA: Diagnosis not present

## 2017-02-16 DIAGNOSIS — R251 Tremor, unspecified: Secondary | ICD-10-CM | POA: Diagnosis not present

## 2017-02-16 DIAGNOSIS — G479 Sleep disorder, unspecified: Secondary | ICD-10-CM | POA: Diagnosis not present

## 2017-03-03 DIAGNOSIS — D692 Other nonthrombocytopenic purpura: Secondary | ICD-10-CM | POA: Diagnosis not present

## 2017-03-03 DIAGNOSIS — L570 Actinic keratosis: Secondary | ICD-10-CM | POA: Diagnosis not present

## 2017-03-03 DIAGNOSIS — D229 Melanocytic nevi, unspecified: Secondary | ICD-10-CM | POA: Diagnosis not present

## 2017-03-03 DIAGNOSIS — D1801 Hemangioma of skin and subcutaneous tissue: Secondary | ICD-10-CM | POA: Diagnosis not present

## 2017-03-03 DIAGNOSIS — L408 Other psoriasis: Secondary | ICD-10-CM | POA: Diagnosis not present

## 2017-03-03 DIAGNOSIS — L80 Vitiligo: Secondary | ICD-10-CM | POA: Diagnosis not present

## 2017-03-03 DIAGNOSIS — L218 Other seborrheic dermatitis: Secondary | ICD-10-CM | POA: Diagnosis not present

## 2017-03-03 DIAGNOSIS — B078 Other viral warts: Secondary | ICD-10-CM | POA: Diagnosis not present

## 2017-04-19 DIAGNOSIS — R251 Tremor, unspecified: Secondary | ICD-10-CM | POA: Diagnosis not present

## 2017-04-19 DIAGNOSIS — F419 Anxiety disorder, unspecified: Secondary | ICD-10-CM | POA: Diagnosis not present

## 2017-04-19 DIAGNOSIS — G479 Sleep disorder, unspecified: Secondary | ICD-10-CM | POA: Diagnosis not present

## 2017-05-13 DIAGNOSIS — R001 Bradycardia, unspecified: Secondary | ICD-10-CM | POA: Diagnosis not present

## 2017-05-13 DIAGNOSIS — I38 Endocarditis, valve unspecified: Secondary | ICD-10-CM | POA: Diagnosis not present

## 2017-05-13 DIAGNOSIS — R42 Dizziness and giddiness: Secondary | ICD-10-CM | POA: Diagnosis not present

## 2017-05-13 DIAGNOSIS — E782 Mixed hyperlipidemia: Secondary | ICD-10-CM | POA: Diagnosis not present

## 2017-06-14 DIAGNOSIS — R251 Tremor, unspecified: Secondary | ICD-10-CM | POA: Diagnosis not present

## 2017-06-14 DIAGNOSIS — G4700 Insomnia, unspecified: Secondary | ICD-10-CM | POA: Diagnosis not present

## 2017-07-30 ENCOUNTER — Telehealth: Payer: Self-pay | Admitting: Family Medicine

## 2017-07-30 NOTE — Telephone Encounter (Signed)
AWV with [facility] @ 120 pm & CPE/Follow-up with Dr. Rosanna Randy @ 2 pm. Thanks TNP

## 2017-08-02 DIAGNOSIS — L408 Other psoriasis: Secondary | ICD-10-CM | POA: Diagnosis not present

## 2017-08-02 DIAGNOSIS — L570 Actinic keratosis: Secondary | ICD-10-CM | POA: Diagnosis not present

## 2017-09-15 DIAGNOSIS — Z23 Encounter for immunization: Secondary | ICD-10-CM | POA: Diagnosis not present

## 2017-09-20 ENCOUNTER — Ambulatory Visit (INDEPENDENT_AMBULATORY_CARE_PROVIDER_SITE_OTHER): Payer: Medicare Other | Admitting: Family Medicine

## 2017-09-20 ENCOUNTER — Ambulatory Visit (INDEPENDENT_AMBULATORY_CARE_PROVIDER_SITE_OTHER): Payer: Medicare Other

## 2017-09-20 VITALS — BP 118/60 | HR 69 | Temp 97.9°F | Ht 70.0 in | Wt 185.6 lb

## 2017-09-20 VITALS — BP 118/60 | HR 69 | Temp 97.9°F | Resp 16 | Ht 70.0 in | Wt 185.0 lb

## 2017-09-20 DIAGNOSIS — I38 Endocarditis, valve unspecified: Secondary | ICD-10-CM

## 2017-09-20 DIAGNOSIS — I10 Essential (primary) hypertension: Secondary | ICD-10-CM | POA: Diagnosis not present

## 2017-09-20 DIAGNOSIS — R5383 Other fatigue: Secondary | ICD-10-CM | POA: Diagnosis not present

## 2017-09-20 DIAGNOSIS — Z0000 Encounter for general adult medical examination without abnormal findings: Secondary | ICD-10-CM | POA: Diagnosis not present

## 2017-09-20 DIAGNOSIS — F419 Anxiety disorder, unspecified: Secondary | ICD-10-CM | POA: Diagnosis not present

## 2017-09-20 DIAGNOSIS — E782 Mixed hyperlipidemia: Secondary | ICD-10-CM | POA: Diagnosis not present

## 2017-09-20 NOTE — Progress Notes (Signed)
Subjective:   William Khan is a 77 y.o. male who presents for Medicare Annual/Subsequent preventive examination.  Review of Systems:  N/A  Cardiac Risk Factors include: advanced age (>79men, >4 women);dyslipidemia;hypertension;male gender     Objective:    Vitals: BP 118/60 (BP Location: Right Arm)   Pulse 69   Temp 97.9 F (36.6 C) (Oral)   Ht 5\' 10"  (1.778 m)   Wt 185 lb 9.6 oz (84.2 kg)   BMI 26.63 kg/m   Body mass index is 26.63 kg/m.  Advanced Directives 09/20/2017 09/16/2016 05/25/2016  Does Patient Have a Medical Advance Directive? No No No  Would patient like information on creating a medical advance directive? No - Patient declined No - Patient declined No - Patient declined    Tobacco Social History   Tobacco Use  Smoking Status Former Smoker  . Years: 20.00  . Last attempt to quit: 01/05/1990  . Years since quitting: 27.7  Smokeless Tobacco Never Used     Counseling given: Not Answered   Clinical Intake:  Pre-visit preparation completed: Yes  Pain : No/denies pain Pain Score: 0-No pain     Nutritional Status: BMI 25 -29 Overweight Nutritional Risks: None Diabetes: No  How often do you need to have someone help you when you read instructions, pamphlets, or other written materials from your doctor or pharmacy?: 1 - Never  Interpreter Needed?: No  Information entered by :: [facility], LPN  Past Medical History:  Diagnosis Date  . Hyperlipidemia   . Hypertension    Past Surgical History:  Procedure Laterality Date  . SHOULDER SURGERY     Family History  Problem Relation Age of Onset  . Arthritis Mother   . Hypertension Mother   . Alzheimer's disease Mother    Social History   Socioeconomic History  . Marital status: Married    Spouse name: Not on file  . Number of children: 2  . Years of education: Not on file  . Highest education level: Some college, no degree  Occupational History  . Occupation: retired  Scientific laboratory technician  .  Financial resource strain: Not hard at all  . Food insecurity:    Worry: Not on file    Inability: Never true  . Transportation needs:    Medical: No    Non-medical: No  Tobacco Use  . Smoking status: Former Smoker    Years: 20.00    Last attempt to quit: 01/05/1990    Years since quitting: 27.7  . Smokeless tobacco: Never Used  Substance and Sexual Activity  . Alcohol use: No  . Drug use: No  . Sexual activity: Not on file  Lifestyle  . Physical activity:    Days per week: Not on file    Minutes per session: Not on file  . Stress: Only a little  Relationships  . Social connections:    Talks on phone: Not on file    Gets together: Not on file    Attends religious service: Not on file    Active member of club or organization: Not on file    Attends meetings of clubs or organizations: Not on file    Relationship status: Not on file  Other Topics Concern  . Not on file  Social History Narrative  . Not on file    Outpatient Encounter Medications as of 09/20/2017  Medication Sig  . ALPRAZolam (XANAX) 0.5 MG tablet Take 0.5 mg by mouth at bedtime as needed.   Marland Kitchen  betamethasone dipropionate (DIPROLENE) 0.05 % ointment Apply to affected area BID until clear  . busPIRone (BUSPAR) 30 MG tablet Take 30 mg by mouth 2 (two) times daily.  . Cholecalciferol (VITAMIN D3) 1000 UNITS CAPS Take 1,000 Units by mouth daily.   . DULoxetine (CYMBALTA) 30 MG capsule Take 30 mg by mouth daily.   . fluticasone (FLONASE) 50 MCG/ACT nasal spray USE TWO SPRAY(S) IN EACH NOSTRIL ONCE DAILY (Patient taking differently: Place 2 sprays into both nostrils as needed. )  . loratadine (CLARITIN) 10 MG tablet TAKE ONE TABLET BY MOUTH ONCE DAILY AS NEEDED FOR  ITCHING  RASH  . mirtazapine (REMERON) 30 MG tablet Take 30 mg by mouth at bedtime.   Marland Kitchen PARoxetine (PAXIL) 30 MG tablet Take 30 mg by mouth daily.   . pravastatin (PRAVACHOL) 40 MG tablet Take 40 mg by mouth daily.   . primidone (MYSOLINE) 50 MG tablet  Take 50 mg by mouth at bedtime.   . propranolol (INDERAL) 20 MG tablet Take 20 mg by mouth 2 (two) times daily.  . risperiDONE (RISPERDAL) 1 MG tablet Take 1 mg by mouth daily.   Marland Kitchen LORazepam (ATIVAN) 0.5 MG tablet Take 0.5 mg by mouth as needed.   Marland Kitchen QUEtiapine (SEROQUEL) 50 MG tablet   . traZODone (DESYREL) 50 MG tablet Take 1 tablet (50 mg total) by mouth at bedtime as needed for sleep. (Patient not taking: Reported on 09/20/2017)   No facility-administered encounter medications on file as of 09/20/2017.     Activities of Daily Living In your present state of health, do you have any difficulty performing the following activities: 09/20/2017  Hearing? N  Vision? N  Difficulty concentrating or making decisions? N  Walking or climbing stairs? N  Dressing or bathing? N  Doing errands, shopping? N  Preparing Food and eating ? N  Using the Toilet? N  In the past six months, have you accidently leaked urine? N  Do you have problems with loss of bowel control? N  Managing your Medications? N  Managing your Finances? N  Housekeeping or managing your Housekeeping? N  Some recent data might be hidden    Patient Care Team: William Khan., MD as PCP - General (Family Medicine) William Bene, MD as Referring Physician (Neurology) William Franklin, NP as Nurse Practitioner (Neurology)   Assessment:   This is a routine wellness examination for William Khan.  Exercise Activities and Dietary recommendations Current Exercise Habits: Home exercise routine, Type of exercise: treadmill;walking, Time (Minutes): 15(to 20 mins), Frequency (Times/Week): 4, Weekly Exercise (Minutes/Week): 60, Intensity: Mild  Goals    . Increase water intake     Recommend increasing water intake to 4-6 glasses a day.       Fall Risk Fall Risk  09/20/2017 09/16/2016 06/21/2014  Falls in the past year? Yes Yes No  Number falls in past yr: 1 2 or more -  Injury with Fall? No No -  Follow up Falls prevention  discussed Falls prevention discussed -   Is the patient's home free of loose throw rugs in walkways, pet beds, electrical cords, etc?   yes      Grab bars in the bathroom? no      Handrails on the stairs?   no      Adequate lighting?   yes  Timed Get Up and Go Performed: N/A  Depression Screen PHQ 2/9 Scores 09/20/2017 09/16/2016 06/21/2014  PHQ - 2 Score 0 0 0  PHQ-  9 Score 1 - -    Cognitive Function: Pt declined screening today.      6CIT Screen 09/16/2016  What Year? 0 points  What month? 0 points  What time? 0 points  Count back from 20 0 points  Months in reverse 0 points  Repeat phrase 6 points  Total Score 6    Immunization History  Administered Date(s) Administered  . Influenza, High Dose Seasonal PF 11/14/2015, 10/22/2016  . Influenza-Unspecified 10/05/2012  . Pneumococcal Conjugate-13 12/04/2013  . Pneumococcal Polysaccharide-23 10/14/2006  . Td 03/10/2007    Qualifies for Shingles Vaccine? Due for Shingles vaccine. Declined my offer to administer today. Education has been provided regarding the importance of this vaccine. Pt has been advised to call her insurance company to determine her out of pocket expense. Advised she may also receive this vaccine at her local pharmacy or Health Dept. Verbalized acceptance and understanding.  Screening Tests Health Maintenance  Topic Date Due  . TETANUS/TDAP  03/09/2017  . INFLUENZA VACCINE  Completed  . PNA vac Low Risk Adult  Completed   Cancer Screenings: Lung: Low Dose CT Chest recommended if Age 57-80 years, 30 pack-year currently smoking OR have quit w/in 15years. Patient does not qualify. Colorectal: Up to date  Additional Screenings:  Hepatitis C Screening: N/A      Plan:  I have personally reviewed and addressed the Medicare Annual Wellness questionnaire and have noted the following in the patient's chart:  A. Medical and social history B. Use of alcohol, tobacco or illicit drugs  C. Current medications  and supplements D. Functional ability and status E.  Nutritional status F.  Physical activity G. Advance directives H. List of other physicians I.  Hospitalizations, surgeries, and ER visits in previous 12 months J.  [place] such as hearing and vision if needed, cognitive and depression L. Referrals and appointments - none  In addition, I have reviewed and discussed with patient certain preventive protocols, quality metrics, and best practice recommendations. A written personalized care plan for preventive services as well as general preventive health recommendations were provided to patient.  See attached scanned questionnaire for additional information.   Signed,  Fabio Neighbors, LPN Nurse Health Advisor   Nurse Recommendations: Pt declined the tetanus vaccine today.

## 2017-09-20 NOTE — Progress Notes (Signed)
Patient: William Khan Male    DOB: [birth year]   76 y.o.   MRN: [id number] Visit Date: 09/20/2017  Today's Provider: Wilhemena Durie, MD   Chief Complaint  Patient presents with  . Hypertension   Subjective:    HPI  Hypertension, follow-up:  BP Readings from Last 3 Encounters:  09/20/17 118/60  09/20/17 118/60  09/16/16 104/64    He was last seen for hypertension 6 months ago.  BP at that visit was 104/68. Management since that visit includes no changes. He reports good compliance with treatment. He is not having side effects.  He is not exercising. He is adherent to low salt diet.   Outside blood pressures are checked daily. He is experiencing none.  Patient denies none.   Cardiovascular risk factors include dyslipidemia.   Weight trend: stable Wt Readings from Last 3 Encounters:  09/20/17 185 lb (83.9 kg)  09/20/17 185 lb 9.6 oz (84.2 kg)  09/16/16 175 lb (79.4 kg)    Current diet: well balanced   He is still having problems with anxiety and depression but feels better lately. He is having worsensing urinary urgency. Allergies  Allergen Reactions  . Ace Inhibitors Swelling and Other (See Comments)     Current Outpatient Medications:  .  ALPRAZolam (XANAX) 0.5 MG tablet, Take 0.5 mg by mouth at bedtime as needed. , Disp: , Rfl:  .  betamethasone dipropionate (DIPROLENE) 0.05 % ointment, Apply to affected area BID until clear, Disp: , Rfl:  .  busPIRone (BUSPAR) 30 MG tablet, Take 30 mg by mouth 2 (two) times daily., Disp: , Rfl:  .  Cholecalciferol (VITAMIN D3) 1000 UNITS CAPS, Take 1,000 Units by mouth daily. , Disp: , Rfl:  .  DULoxetine (CYMBALTA) 30 MG capsule, Take 30 mg by mouth daily. , Disp: , Rfl:  .  fluticasone (FLONASE) 50 MCG/ACT nasal spray, USE TWO SPRAY(S) IN EACH NOSTRIL ONCE DAILY (Patient taking differently: Place 2 sprays into both nostrils as needed. ), Disp: 16 g, Rfl: 12 .  loratadine (CLARITIN) 10 MG tablet, TAKE ONE TABLET  BY MOUTH ONCE DAILY AS NEEDED FOR  ITCHING  RASH, Disp: 90 tablet, Rfl: 3 .  LORazepam (ATIVAN) 0.5 MG tablet, Take 0.5 mg by mouth as needed. , Disp: , Rfl:  .  mirtazapine (REMERON) 30 MG tablet, Take 30 mg by mouth at bedtime. , Disp: , Rfl:  .  PARoxetine (PAXIL) 30 MG tablet, Take 30 mg by mouth daily. , Disp: , Rfl:  .  pravastatin (PRAVACHOL) 40 MG tablet, Take 40 mg by mouth daily. , Disp: , Rfl:  .  primidone (MYSOLINE) 50 MG tablet, Take 50 mg by mouth at bedtime. , Disp: , Rfl:  .  propranolol (INDERAL) 20 MG tablet, Take 20 mg by mouth 2 (two) times daily., Disp: , Rfl:  .  QUEtiapine (SEROQUEL) 50 MG tablet, , Disp: , Rfl:  .  risperiDONE (RISPERDAL) 1 MG tablet, Take 1 mg by mouth daily. , Disp: , Rfl:  .  traZODone (DESYREL) 50 MG tablet, Take 1 tablet (50 mg total) by mouth at bedtime as needed for sleep. (Patient not taking: Reported on 09/20/2017), Disp: 30 tablet, Rfl: 12  Review of Systems  Constitutional: Negative for activity change, appetite change, chills, diaphoresis, fatigue and unexpected weight change.  HENT: Negative.   Respiratory: Negative for cough and shortness of breath.   Cardiovascular: Negative for chest pain, palpitations and leg swelling.  Gastrointestinal:  Negative.   Endocrine: Negative.   Genitourinary: Negative.   Musculoskeletal: Negative for arthralgias.  Allergic/Immunologic: Negative.   Neurological: Negative for dizziness and speech difficulty.  Psychiatric/Behavioral: Positive for agitation.    Social History   Tobacco Use  . Smoking status: Former Smoker    Years: 20.00    Last attempt to quit: 01/05/1990    Years since quitting: 27.7  . Smokeless tobacco: Never Used  Substance Use Topics  . Alcohol use: No   Objective:   BP 118/60   Pulse 69   Temp 97.9 F (36.6 C)   Resp 16   Ht 5\' 10"  (1.778 m)   Wt 185 lb (83.9 kg)   BMI 26.54 kg/m  Vitals:   09/20/17 1419  BP: 118/60  Pulse: 69  Resp: 16  Temp: 97.9 F (36.6 C)    Weight: 185 lb (83.9 kg)  Height: 5\' 10"  (1.778 m)     Physical Exam  Constitutional: He is oriented to person, place, and time. He appears well-developed and well-nourished.  HENT:  Head: Normocephalic and atraumatic.  Right Ear: External ear normal.  Left Ear: External ear normal.  Nose: Nose normal.  Mouth/Throat: Oropharynx is clear and moist.  Eyes: No scleral icterus.  Right pupil chronically maximally dilated.  Neck: No thyromegaly present.  Cardiovascular: Normal rate, regular rhythm, normal heart sounds and intact distal pulses.  Pulmonary/Chest: Effort normal and breath sounds normal.  Abdominal: Soft.  Musculoskeletal: He exhibits no edema.  Neurological: He is alert and oriented to person, place, and time.  Skin: Skin is warm and dry.  Fair skin.  Psychiatric: He has a normal mood and affect. His behavior is normal. Judgment and thought content normal.        Assessment & Plan:     1. Essential (primary) hypertension Stable. - Comprehensive metabolic panel - Lipid panel  2. Heart valve disease   3. Anxiety   4. Other fatigue Probably multifactorial and worsened by chronic anxiety/depression. - CBC with Differential/Platelet - TSH 5.HLD      I have done the exam and reviewed the above chart and it is accurate to the best of my knowledge. Development worker, community has been used in this note in any air is in the dictation or transcription are unintentional.  Wilhemena Durie, MD  [place]

## 2017-09-20 NOTE — Patient Instructions (Addendum)
William Khan , Thank you for taking time to come for your Medicare Wellness Visit. I appreciate your ongoing commitment to your health goals. Please review the following plan we discussed and let me know if I can assist you in the future.   Screening recommendations/referrals: Colonoscopy: Up to date Recommended yearly ophthalmology/optometry visit for glaucoma screening and checkup Recommended yearly dental visit for hygiene and checkup  Vaccinations: Influenza vaccine: Up to date Pneumococcal vaccine: Up to date Tdap vaccine: Pt declines today.  Shingles vaccine: Pt declines today.     Advanced directives: Advance directive discussed with you today. Even though you declined this today please call our office should you change your mind and we can give you the proper paperwork for you to fill out.  Conditions/risks identified: Recommend to continue trying to increase water intake to 6-8 glasses a day.   Next appointment: 2:00 today with Dr Rosanna Randy.  Preventive Care 79 Years and Older, Male Preventive care refers to lifestyle choices and visits with your health care provider that can promote health and wellness. What does preventive care include?  A yearly physical exam. This is also called an annual well check.  Dental exams once or twice a year.  Routine eye exams. Ask your health care provider how often you should have your eyes checked.  Personal lifestyle choices, including:  Daily care of your teeth and gums.  Regular physical activity.  Eating a healthy diet.  Avoiding tobacco and drug use.  Limiting alcohol use.  Practicing safe sex.  Taking low doses of aspirin every day.  Taking vitamin and mineral supplements as recommended by your health care provider. What happens during an annual well check? The services and screenings done by your health care provider during your annual well check will depend on your age, overall health, lifestyle risk factors, and family  history of disease. Counseling  Your health care provider may ask you questions about your:  Alcohol use.  Tobacco use.  Drug use.  Emotional well-being.  Home and relationship well-being.  Sexual activity.  Eating habits.  History of falls.  Memory and ability to understand (cognition).  Work and work Statistician. Screening  You may have the following tests or measurements:  Height, weight, and BMI.  Blood pressure.  Lipid and cholesterol levels. These may be checked every 5 years, or more frequently if you are over 68 years old.  Skin check.  Lung cancer screening. You may have this screening every year starting at age 36 if you have a 30-pack-year history of smoking and currently smoke or have quit within the past 15 years.  Fecal occult blood test (FOBT) of the stool. You may have this test every year starting at age 35.  Flexible sigmoidoscopy or colonoscopy. You may have a sigmoidoscopy every 5 years or a colonoscopy every 10 years starting at age 83.  Prostate cancer screening. Recommendations will vary depending on your family history and other risks.  Hepatitis C blood test.  Hepatitis B blood test.  Sexually transmitted disease (STD) testing.  Diabetes screening. This is done by checking your blood sugar (glucose) after you have not eaten for a while (fasting). You may have this done every 1-3 years.  Abdominal aortic aneurysm (AAA) screening. You may need this if you are a current or former smoker.  Osteoporosis. You may be screened starting at age 23 if you are at high risk. Talk with your health care provider about your test results, treatment options, and if  necessary, the need for more tests. Vaccines  Your health care provider may recommend certain vaccines, such as:  Influenza vaccine. This is recommended every year.  Tetanus, diphtheria, and acellular pertussis (Tdap, Td) vaccine. You may need a Td booster every 10 years.  Zoster vaccine.  You may need this after age 75.  Pneumococcal 13-valent conjugate (PCV13) vaccine. One dose is recommended after age 59.  Pneumococcal polysaccharide (PPSV23) vaccine. One dose is recommended after age 83. Talk to your health care provider about which screenings and vaccines you need and how often you need them. This information is not intended to replace advice given to you by your health care provider. Make sure you discuss any questions you have with your health care provider. Document Released: 01/18/2015 Document Revised: 09/11/2015 Document Reviewed: 10/23/2014 Elsevier Interactive Patient Education  2017 [place] Prevention in the Home Falls can cause injuries. They can happen to people of all ages. There are many things you can do to make your home safe and to help prevent falls. What can I do on the outside of my home?  Regularly fix the edges of walkways and driveways and fix any cracks.  Remove anything that might make you trip as you walk through a door, such as a raised step or threshold.  Trim any bushes or trees on the path to your home.  Use bright outdoor lighting.  Clear any walking paths of anything that might make someone trip, such as rocks or tools.  Regularly check to see if handrails are loose or broken. Make sure that both sides of any steps have handrails.  Any raised decks and porches should have guardrails on the edges.  Have any leaves, snow, or ice cleared regularly.  Use sand or salt on walking paths during winter.  Clean up any spills in your garage right away. This includes oil or grease spills. What can I do in the bathroom?  Use night lights.  Install grab bars by the toilet and in the tub and shower. Do not use towel bars as grab bars.  Use non-skid mats or decals in the tub or shower.  If you need to sit down in the shower, use a plastic, non-slip stool.  Keep the floor dry. Clean up any water that spills on the floor as soon  as it happens.  Remove soap buildup in the tub or shower regularly.  Attach bath mats securely with double-sided non-slip rug tape.  Do not have throw rugs and other things on the floor that can make you trip. What can I do in the bedroom?  Use night lights.  Make sure that you have a light by your bed that is easy to reach.  Do not use any sheets or blankets that are too big for your bed. They should not hang down onto the floor.  Have a firm chair that has side arms. You can use this for support while you get dressed.  Do not have throw rugs and other things on the floor that can make you trip. What can I do in the kitchen?  Clean up any spills right away.  Avoid walking on wet floors.  Keep items that you use a lot in easy-to-reach places.  If you need to reach something above you, use a strong step stool that has a grab bar.  Keep electrical cords out of the way.  Do not use floor polish or wax that makes floors slippery. If you must  use wax, use non-skid floor wax.  Do not have throw rugs and other things on the floor that can make you trip. What can I do with my stairs?  Do not leave any items on the stairs.  Make sure that there are handrails on both sides of the stairs and use them. Fix handrails that are broken or loose. Make sure that handrails are as long as the stairways.  Check any carpeting to make sure that it is firmly attached to the stairs. Fix any carpet that is loose or worn.  Avoid having throw rugs at the top or bottom of the stairs. If you do have throw rugs, attach them to the floor with carpet tape.  Make sure that you have a light switch at the top of the stairs and the bottom of the stairs. If you do not have them, ask someone to add them for you. What else can I do to help prevent falls?  Wear shoes that:  Do not have high heels.  Have rubber bottoms.  Are comfortable and fit you well.  Are closed at the toe. Do not wear sandals.  If  you use a stepladder:  Make sure that it is fully opened. Do not climb a closed stepladder.  Make sure that both sides of the stepladder are locked into place.  Ask someone to hold it for you, if possible.  Clearly mark and make sure that you can see:  Any grab bars or handrails.  First and last steps.  Where the edge of each step is.  Use tools that help you move around (mobility aids) if they are needed. These include:  Canes.  Walkers.  Scooters.  Crutches.  Turn on the lights when you go into a dark area. Replace any light bulbs as soon as they burn out.  Set up your furniture so you have a clear path. Avoid moving your furniture around.  If any of your floors are uneven, fix them.  If there are any pets around you, be aware of where they are.  Review your medicines with your doctor. Some medicines can make you feel dizzy. This can increase your chance of falling. Ask your doctor what other things that you can do to help prevent falls. This information is not intended to replace advice given to you by your health care provider. Make sure you discuss any questions you have with your health care provider. Document Released: 10/18/2008 Document Revised: 05/30/2015 Document Reviewed: 01/26/2014 Elsevier Interactive Patient Education  2017 [employer].

## 2017-09-23 DIAGNOSIS — I10 Essential (primary) hypertension: Secondary | ICD-10-CM | POA: Diagnosis not present

## 2017-09-23 DIAGNOSIS — R5383 Other fatigue: Secondary | ICD-10-CM | POA: Diagnosis not present

## 2017-09-24 LAB — COMPREHENSIVE METABOLIC PANEL
A/G RATIO: 2.2 (ref 1.2–2.2)
ALBUMIN: 4.3 g/dL (ref 3.5–4.8)
ALK PHOS: 89 IU/L (ref 39–117)
ALT: 16 IU/L (ref 0–44)
AST: 16 IU/L (ref 0–40)
BILIRUBIN TOTAL: 0.3 mg/dL (ref 0.0–1.2)
BUN / CREAT RATIO: 9 — AB (ref 10–24)
BUN: 8 mg/dL (ref 8–27)
CHLORIDE: 101 mmol/L (ref 96–106)
CO2: 24 mmol/L (ref 20–29)
Calcium: 9.2 mg/dL (ref 8.6–10.2)
Creatinine, Ser: 0.92 mg/dL (ref 0.76–1.27)
GFR calc non Af Amer: 79 mL/min/{1.73_m2} (ref >59)
GFR, EST AFRICAN AMERICAN: 91 mL/min/{1.73_m2} (ref >59)
GLUCOSE: 123 mg/dL — AB (ref 65–99)
Globulin, Total: 2 g/dL (ref 1.5–4.5)
POTASSIUM: 4.1 mmol/L (ref 3.5–5.2)
Sodium: 142 mmol/L (ref 134–144)
Total Protein: 6.3 g/dL (ref 6.0–8.5)

## 2017-09-24 LAB — CBC WITH DIFFERENTIAL/PLATELET
BASOS ABS: 0.1 10*3/uL (ref 0.0–0.2)
Basos: 2 %
EOS (ABSOLUTE): 0.3 10*3/uL (ref 0.0–0.4)
Eos: 6 %
HEMOGLOBIN: 14.2 g/dL (ref 13.0–17.7)
Hematocrit: 39.9 % (ref 37.5–51.0)
Immature Grans (Abs): 0 10*3/uL (ref 0.0–0.1)
Immature Granulocytes: 1 %
LYMPHS ABS: 1.9 10*3/uL (ref 0.7–3.1)
Lymphs: 34 %
MCH: 32.1 pg (ref 26.6–33.0)
MCHC: 35.6 g/dL (ref 31.5–35.7)
MCV: 90 fL (ref 79–97)
MONOCYTES: 10 %
Monocytes Absolute: 0.6 10*3/uL (ref 0.1–0.9)
Neutrophils Absolute: 2.7 10*3/uL (ref 1.4–7.0)
Neutrophils: 47 %
Platelets: 172 10*3/uL (ref 150–450)
RBC: 4.42 x10E6/uL (ref 4.14–5.80)
RDW: 12.8 % (ref 12.3–15.4)
WBC: 5.6 10*3/uL (ref 3.4–10.8)

## 2017-09-24 LAB — LIPID PANEL
Chol/HDL Ratio: 4.7 ratio (ref 0.0–5.0)
Cholesterol, Total: 165 mg/dL (ref 100–199)
HDL: 35 mg/dL — AB (ref >39)
LDL Calculated: 103 mg/dL — ABNORMAL HIGH (ref 0–99)
Triglycerides: 137 mg/dL (ref 0–149)
VLDL Cholesterol Cal: 27 mg/dL (ref 5–40)

## 2017-09-24 LAB — TSH: TSH: 4.71 u[IU]/mL — AB (ref 0.450–4.500)

## 2018-01-03 DIAGNOSIS — H35371 Puckering of macula, right eye: Secondary | ICD-10-CM | POA: Diagnosis not present

## 2018-01-11 DIAGNOSIS — J0140 Acute pansinusitis, unspecified: Secondary | ICD-10-CM | POA: Diagnosis not present

## 2018-01-18 DIAGNOSIS — R251 Tremor, unspecified: Secondary | ICD-10-CM | POA: Diagnosis not present

## 2018-01-18 DIAGNOSIS — F411 Generalized anxiety disorder: Secondary | ICD-10-CM | POA: Diagnosis not present

## 2018-01-18 DIAGNOSIS — G479 Sleep disorder, unspecified: Secondary | ICD-10-CM | POA: Diagnosis not present

## 2018-01-31 ENCOUNTER — Other Ambulatory Visit: Payer: Self-pay | Admitting: Family Medicine

## 2018-05-16 DIAGNOSIS — E782 Mixed hyperlipidemia: Secondary | ICD-10-CM | POA: Diagnosis not present

## 2018-05-16 DIAGNOSIS — R001 Bradycardia, unspecified: Secondary | ICD-10-CM | POA: Diagnosis not present

## 2018-05-16 DIAGNOSIS — I059 Rheumatic mitral valve disease, unspecified: Secondary | ICD-10-CM | POA: Diagnosis not present

## 2018-05-16 DIAGNOSIS — I10 Essential (primary) hypertension: Secondary | ICD-10-CM | POA: Diagnosis not present

## 2018-06-09 DIAGNOSIS — L03032 Cellulitis of left toe: Secondary | ICD-10-CM | POA: Diagnosis not present

## 2018-06-09 DIAGNOSIS — L03031 Cellulitis of right toe: Secondary | ICD-10-CM | POA: Diagnosis not present

## 2018-06-23 DIAGNOSIS — L03031 Cellulitis of right toe: Secondary | ICD-10-CM | POA: Diagnosis not present

## 2018-06-23 DIAGNOSIS — L03032 Cellulitis of left toe: Secondary | ICD-10-CM | POA: Diagnosis not present

## 2018-08-08 ENCOUNTER — Telehealth: Payer: Self-pay | Admitting: Family Medicine

## 2018-08-08 NOTE — Telephone Encounter (Signed)
William Khan - [phone]  Wanting to know if Dr. Rosanna Randy received a fax regarding labs for pt. Needing that signed and faxed back.  She will resend it again today.   Please call her back to let her know.    Thanks, [employer]

## 2018-08-09 NOTE — Telephone Encounter (Signed)
Placed in your stack to review. Thanks!  

## 2018-08-10 NOTE — Telephone Encounter (Signed)
Spoke with representative about lab /order sent to office he states that patient requested to have genetic testing done to share with his family. Per Dr. Rosanna Randy patient would need to be seen in office to discuss/and or address.

## 2018-08-10 NOTE — Telephone Encounter (Signed)
I did not order ,I will not sign for this scam.

## 2018-08-11 DIAGNOSIS — L570 Actinic keratosis: Secondary | ICD-10-CM | POA: Diagnosis not present

## 2018-08-11 DIAGNOSIS — D485 Neoplasm of uncertain behavior of skin: Secondary | ICD-10-CM | POA: Diagnosis not present

## 2018-08-11 DIAGNOSIS — L578 Other skin changes due to chronic exposure to nonionizing radiation: Secondary | ICD-10-CM | POA: Diagnosis not present

## 2018-08-11 DIAGNOSIS — L708 Other acne: Secondary | ICD-10-CM | POA: Diagnosis not present

## 2018-08-11 DIAGNOSIS — L408 Other psoriasis: Secondary | ICD-10-CM | POA: Diagnosis not present

## 2018-08-11 DIAGNOSIS — L905 Scar conditions and fibrosis of skin: Secondary | ICD-10-CM | POA: Diagnosis not present

## 2018-09-21 NOTE — Progress Notes (Signed)
Subjective:   William Khan is a 78 y.o. male who presents for Medicare Annual/Subsequent preventive examination.    This visit is being conducted through telemedicine due to the COVID-19 pandemic. This patient has given me verbal consent via doximity to conduct this visit, patient states they are participating from their home address. Some vital signs may be absent or patient reported.    Patient identification: identified by name, DOB, and current address  Review of Systems:  N/A  Cardiac Risk Factors include: advanced age (>52men, >80 women);dyslipidemia;male gender     Objective:    Vitals: There were no vitals taken for this visit.  There is no height or weight on file to calculate BMI. Unable to obtain vitals due to visit being conducted via telephonically.   Advanced Directives 09/22/2018 09/20/2017 09/16/2016 05/25/2016  Does Patient Have a Medical Advance Directive? No No No No  Would patient like information on creating a medical advance directive? No - Patient declined No - Patient declined No - Patient declined No - Patient declined    Tobacco Social History   Tobacco Use  Smoking Status Former Smoker  . Years: 20.00  . Quit date: 01/05/1990  . Years since quitting: 28.7  Smokeless Tobacco Never Used     Counseling given: Not Answered   Clinical Intake:  Pre-visit preparation completed: Yes  Pain : No/denies pain Pain Score: 0-No pain     Nutritional Risks: None Diabetes: No  How often do you need to have someone help you when you read instructions, pamphlets, or other written materials from your doctor or pharmacy?: 1 - Never  Interpreter Needed?: No  Information entered by :: [facility], LPN  Past Medical History:  Diagnosis Date  . Hyperlipidemia   . Hypertension    Past Surgical History:  Procedure Laterality Date  . SHOULDER SURGERY     Family History  Problem Relation Age of Onset  . Arthritis Mother   . Hypertension Mother   .  Alzheimer's disease Mother    Social History   Socioeconomic History  . Marital status: Married    Spouse name: Not on file  . Number of children: 2  . Years of education: Not on file  . Highest education level: Some college, no degree  Occupational History  . Occupation: retired  Scientific laboratory technician  . Financial resource strain: Not hard at all  . Food insecurity    Worry: Not on file    Inability: Never true  . Transportation needs    Medical: No    Non-medical: No  Tobacco Use  . Smoking status: Former Smoker    Years: 20.00    Quit date: 01/05/1990    Years since quitting: 28.7  . Smokeless tobacco: Never Used  Substance and Sexual Activity  . Alcohol use: No  . Drug use: No  . Sexual activity: Not on file  Lifestyle  . Physical activity    Days per week: 0 days    Minutes per session: 0 min  . Stress: Not at all  Relationships  . Social Herbalist on phone: Patient refused    Gets together: Patient refused    Attends religious service: Patient refused    Active member of club or organization: Patient refused    Attends meetings of clubs or organizations: Patient refused    Relationship status: Patient refused  Other Topics Concern  . Not on file  Social History Narrative  . Not on file  Outpatient Encounter Medications as of 09/22/2018  Medication Sig  . betamethasone dipropionate (DIPROLENE) 0.05 % ointment Apply to affected area BID until clear  . Cholecalciferol (VITAMIN D3) 1000 UNITS CAPS Take 1,000 Units by mouth daily.   . DULoxetine (CYMBALTA) 30 MG capsule Take 30 mg by mouth daily.   . fluticasone (FLONASE) 50 MCG/ACT nasal spray USE TWO SPRAY(S) IN EACH NOSTRIL ONCE DAILY (Patient taking differently: Place 2 sprays into both nostrils as needed. )  . loratadine (CLARITIN) 10 MG tablet Take 1 daily prn itching  . LORazepam (ATIVAN) 0.5 MG tablet Take 0.5 mg by mouth as needed.   . pravastatin (PRAVACHOL) 40 MG tablet Take 40 mg by mouth daily.    . primidone (MYSOLINE) 50 MG tablet Take 50 mg by mouth at bedtime.   . propranolol (INDERAL) 20 MG tablet Take 20 mg by mouth 2 (two) times daily.  . temazepam (RESTORIL) 15 MG capsule Take 15 mg by mouth at bedtime as needed.   . ALPRAZolam (XANAX) 0.5 MG tablet Take 0.5 mg by mouth at bedtime as needed.   . busPIRone (BUSPAR) 30 MG tablet Take 30 mg by mouth 2 (two) times daily.  . mirtazapine (REMERON) 30 MG tablet Take 30 mg by mouth at bedtime.   Marland Kitchen PARoxetine (PAXIL) 30 MG tablet Take 30 mg by mouth daily.   . QUEtiapine (SEROQUEL) 50 MG tablet   . risperiDONE (RISPERDAL) 1 MG tablet Take 1 mg by mouth daily.   . traZODone (DESYREL) 50 MG tablet Take 1 tablet (50 mg total) by mouth at bedtime as needed for sleep. (Patient not taking: Reported on 09/20/2017)   No facility-administered encounter medications on file as of 09/22/2018.     Activities of Daily Living In your present state of health, do you have any difficulty performing the following activities: 09/22/2018  Hearing? N  Vision? N  Difficulty concentrating or making decisions? N  Walking or climbing stairs? N  Dressing or bathing? N  Doing errands, shopping? N  Preparing Food and eating ? N  Using the Toilet? N  In the past six months, have you accidently leaked urine? Y  Comment Occasionally.  Do you have problems with loss of bowel control? N  Managing your Medications? N  Managing your Finances? N  Housekeeping or managing your Housekeeping? N  Some recent data might be hidden    Patient Care Team: Jerrol Banana., MD as PCP - General (Family Medicine) Anabel Bene, MD as Referring Physician (Neurology) Jannifer Franklin, NP as Nurse Practitioner (Neurology) Corey Skains, MD as Consulting Physician (Cardiology)   Assessment:   This is a routine wellness examination for William Khan.  Exercise Activities and Dietary recommendations Current Exercise Habits: Home exercise routine, Type of exercise:  walking;treadmill, Time (Minutes): 15, Frequency (Times/Week): 2, Weekly Exercise (Minutes/Week): 30, Intensity: Mild, Exercise limited by: None identified  Goals    . Increase water intake     Recommend increasing water intake to 4-6 glasses a day.    Marland Kitchen LIFESTYLE - DECREASE FALLS RISK     Recommend to remove any items from the home that may cause slips or trips.       Fall Risk: Fall Risk  09/22/2018 09/20/2017 09/16/2016 06/21/2014  Falls in the past year? 1 Yes Yes No  Number falls in past yr: 0 1 2 or more -  Injury with Fall? 0 No No -  Follow up Falls prevention discussed Falls prevention discussed Falls  prevention discussed -    FALL RISK PREVENTION PERTAINING TO THE HOME:  Any stairs in or around the home? Yes  If so, are there any without handrails? No   Home free of loose throw rugs in walkways, pet beds, electrical cords, etc? Yes  Adequate lighting in your home to reduce risk of falls? Yes   ASSISTIVE DEVICES UTILIZED TO PREVENT FALLS:  Life alert? No  Use of a cane, walker or w/c? No  Grab bars in the bathroom? No  Shower chair or bench in shower? No  Elevated toilet seat or a handicapped toilet? No   TIMED UP AND GO:  Was the test performed? No .    Depression Screen PHQ 2/9 Scores 09/22/2018 09/20/2017 09/16/2016 06/21/2014  PHQ - 2 Score 0 0 0 0  PHQ- 9 Score - 1 - -    Cognitive Function: Declined today.     6CIT Screen 09/16/2016  What Year? 0 points  What month? 0 points  What time? 0 points  Count back from 20 0 points  Months in reverse 0 points  Repeat phrase 6 points  Total Score 6    Immunization History  Administered Date(s) Administered  . Influenza, High Dose Seasonal PF 11/14/2015, 10/22/2016  . Influenza-Unspecified 10/05/2012, 09/15/2017  . Pneumococcal Conjugate-13 12/04/2013  . Pneumococcal Polysaccharide-23 10/14/2006  . Td 03/10/2007  . Zoster Recombinat (Shingrix) 08/04/2018    Qualifies for Shingles Vaccine? Yes ,  awaiting Shingrix #2 dose.   Tdap: Although this vaccine is not a covered service during a Wellness Exam, does the patient still wish to receive this vaccine today?  No .   Flu Vaccine: Due for Flu vaccine. Does the patient want to receive this vaccine today?  No .   Pneumococcal Vaccine: Completed series  Screening Tests Health Maintenance  Topic Date Due  . TETANUS/TDAP  03/09/2017  . INFLUENZA VACCINE  08/06/2018  . PNA vac Low Risk Adult  Completed   Cancer Screenings:  Colorectal Screening: No longer required.   Lung Cancer Screening: (Low Dose CT Chest recommended if Age 91-80 years, 30 pack-year currently smoking OR have quit w/in 15years.) does not qualify.   Additional Screening:  Vision Screening: Recommended annual ophthalmology exams for early detection of glaucoma and other disorders of the eye.  Dental Screening: Recommended annual dental exams for proper oral hygiene  Community Resource Referral:  CRR required this visit?  No        Plan:  I have personally reviewed and addressed the Medicare Annual Wellness questionnaire and have noted the following in the patient's chart:  A. Medical and social history B. Use of alcohol, tobacco or illicit drugs  C. Current medications and supplements D. Functional ability and status E.  Nutritional status F.  Physical activity G. Advance directives H. List of other physicians I.  Hospitalizations, surgeries, and ER visits in previous 12 months J.  [place] such as hearing and vision if needed, cognitive and depression L. Referrals and appointments   In addition, I have reviewed and discussed with patient certain preventive protocols, quality metrics, and best practice recommendations. A written personalized care plan for preventive services as well as general preventive health recommendations were provided to patient.   [place]ora Score, [place]  [id number] Nurse Health Advisor   Nurse Notes:  Pt to receive an influenza vaccine at next in office visit.

## 2018-09-22 ENCOUNTER — Ambulatory Visit (INDEPENDENT_AMBULATORY_CARE_PROVIDER_SITE_OTHER): Payer: Medicare Other

## 2018-09-22 ENCOUNTER — Encounter: Payer: Medicare Other | Admitting: Family Medicine

## 2018-09-22 ENCOUNTER — Other Ambulatory Visit: Payer: Self-pay

## 2018-09-22 DIAGNOSIS — Z0000 Encounter for general adult medical examination without abnormal findings: Secondary | ICD-10-CM | POA: Diagnosis not present

## 2018-09-22 NOTE — Patient Instructions (Signed)
[name] , Thank you for taking time to come for your Medicare Wellness Visit. I appreciate your ongoing commitment to your health goals. Please review the following plan we discussed and let me know if I can assist you in the future.   Screening recommendations/referrals: Colonoscopy: No longer required.  Recommended yearly ophthalmology/optometry visit for glaucoma screening and checkup Recommended yearly dental visit for hygiene and checkup  Vaccinations: Influenza vaccine: Pt declines today.  Pneumococcal vaccine: Completed series Tdap vaccine: Pt declines today.  Shingles vaccine: Up to date, Shingix # 2    Advanced directives: Advance directive discussed with you today. Even though you declined this today please call our office should you change your mind and we can give you the proper paperwork for you to fill out.  Conditions/risks identified: Fall risk prevention discussed with you today.   Next appointment: 10/13/18 @ 3:00 PM with Dr Rosanna Randy.   Preventive Care 80 Years and Older, Male Preventive care refers to lifestyle choices and visits with your health care provider that can promote health and wellness. What does preventive care include?  A yearly physical exam. This is also called an annual well check.  Dental exams once or twice a year.  Routine eye exams. Ask your health care provider how often you should have your eyes checked.  Personal lifestyle choices, including:  Daily care of your teeth and gums.  Regular physical activity.  Eating a healthy diet.  Avoiding tobacco and drug use.  Limiting alcohol use.  Practicing safe sex.  Taking low doses of aspirin every day.  Taking vitamin and mineral supplements as recommended by your health care provider. What happens during an annual well check? The services and screenings done by your health care provider during your annual well check will depend on your age, overall health, lifestyle risk factors, and  family history of disease. Counseling  Your health care provider may ask you questions about your:  Alcohol use.  Tobacco use.  Drug use.  Emotional well-being.  Home and relationship well-being.  Sexual activity.  Eating habits.  History of falls.  Memory and ability to understand (cognition).  Work and work Statistician. Screening  You may have the following tests or measurements:  Height, weight, and BMI.  Blood pressure.  Lipid and cholesterol levels. These may be checked every 5 years, or more frequently if you are over 67 years old.  Skin check.  Lung cancer screening. You may have this screening every year starting at age 65 if you have a 30-pack-year history of smoking and currently smoke or have quit within the past 15 years.  Fecal occult blood test (FOBT) of the stool. You may have this test every year starting at age 27.  Flexible sigmoidoscopy or colonoscopy. You may have a sigmoidoscopy every 5 years or a colonoscopy every 10 years starting at age 74.  Prostate cancer screening. Recommendations will vary depending on your family history and other risks.  Hepatitis C blood test.  Hepatitis B blood test.  Sexually transmitted disease (STD) testing.  Diabetes screening. This is done by checking your blood sugar (glucose) after you have not eaten for a while (fasting). You may have this done every 1-3 years.  Abdominal aortic aneurysm (AAA) screening. You may need this if you are a current or former smoker.  Osteoporosis. You may be screened starting at age 16 if you are at high risk. Talk with your health care provider about your test results, treatment options, and if  necessary, the need for more tests. Vaccines  Your health care provider may recommend certain vaccines, such as:  Influenza vaccine. This is recommended every year.  Tetanus, diphtheria, and acellular pertussis (Tdap, Td) vaccine. You may need a Td booster every 10 years.  Zoster  vaccine. You may need this after age 24.  Pneumococcal 13-valent conjugate (PCV13) vaccine. One dose is recommended after age 27.  Pneumococcal polysaccharide (PPSV23) vaccine. One dose is recommended after age 23. Talk to your health care provider about which screenings and vaccines you need and how often you need them. This information is not intended to replace advice given to you by your health care provider. Make sure you discuss any questions you have with your health care provider. Document Released: 01/18/2015 Document Revised: 09/11/2015 Document Reviewed: 10/23/2014 Elsevier Interactive Patient Education  2017 [place] Prevention in the Home Falls can cause injuries. They can happen to people of all ages. There are many things you can do to make your home safe and to help prevent falls. What can I do on the outside of my home?  Regularly fix the edges of walkways and driveways and fix any cracks.  Remove anything that might make you trip as you walk through a door, such as a raised step or threshold.  Trim any bushes or trees on the path to your home.  Use bright outdoor lighting.  Clear any walking paths of anything that might make someone trip, such as rocks or tools.  Regularly check to see if handrails are loose or broken. Make sure that both sides of any steps have handrails.  Any raised decks and porches should have guardrails on the edges.  Have any leaves, snow, or ice cleared regularly.  Use sand or salt on walking paths during winter.  Clean up any spills in your garage right away. This includes oil or grease spills. What can I do in the bathroom?  Use night lights.  Install grab bars by the toilet and in the tub and shower. Do not use towel bars as grab bars.  Use non-skid mats or decals in the tub or shower.  If you need to sit down in the shower, use a plastic, non-slip stool.  Keep the floor dry. Clean up any water that spills on the  floor as soon as it happens.  Remove soap buildup in the tub or shower regularly.  Attach bath mats securely with double-sided non-slip rug tape.  Do not have throw rugs and other things on the floor that can make you trip. What can I do in the bedroom?  Use night lights.  Make sure that you have a light by your bed that is easy to reach.  Do not use any sheets or blankets that are too big for your bed. They should not hang down onto the floor.  Have a firm chair that has side arms. You can use this for support while you get dressed.  Do not have throw rugs and other things on the floor that can make you trip. What can I do in the kitchen?  Clean up any spills right away.  Avoid walking on wet floors.  Keep items that you use a lot in easy-to-reach places.  If you need to reach something above you, use a strong step stool that has a grab bar.  Keep electrical cords out of the way.  Do not use floor polish or wax that makes floors slippery. If you must  use wax, use non-skid floor wax.  Do not have throw rugs and other things on the floor that can make you trip. What can I do with my stairs?  Do not leave any items on the stairs.  Make sure that there are handrails on both sides of the stairs and use them. Fix handrails that are broken or loose. Make sure that handrails are as long as the stairways.  Check any carpeting to make sure that it is firmly attached to the stairs. Fix any carpet that is loose or worn.  Avoid having throw rugs at the top or bottom of the stairs. If you do have throw rugs, attach them to the floor with carpet tape.  Make sure that you have a light switch at the top of the stairs and the bottom of the stairs. If you do not have them, ask someone to add them for you. What else can I do to help prevent falls?  Wear shoes that:  Do not have high heels.  Have rubber bottoms.  Are comfortable and fit you well.  Are closed at the toe. Do not wear  sandals.  If you use a stepladder:  Make sure that it is fully opened. Do not climb a closed stepladder.  Make sure that both sides of the stepladder are locked into place.  Ask someone to hold it for you, if possible.  Clearly mark and make sure that you can see:  Any grab bars or handrails.  First and last steps.  Where the edge of each step is.  Use tools that help you move around (mobility aids) if they are needed. These include:  Canes.  Walkers.  Scooters.  Crutches.  Turn on the lights when you go into a dark area. Replace any light bulbs as soon as they burn out.  Set up your furniture so you have a clear path. Avoid moving your furniture around.  If any of your floors are uneven, fix them.  If there are any pets around you, be aware of where they are.  Review your medicines with your doctor. Some medicines can make you feel dizzy. This can increase your chance of falling. Ask your doctor what other things that you can do to help prevent falls. This information is not intended to replace advice given to you by your health care provider. Make sure you discuss any questions you have with your health care provider. Document Released: 10/18/2008 Document Revised: 05/30/2015 Document Reviewed: 01/26/2014 Elsevier Interactive Patient Education  2017 [employer].

## 2018-10-13 ENCOUNTER — Other Ambulatory Visit: Payer: Self-pay

## 2018-10-13 ENCOUNTER — Ambulatory Visit (INDEPENDENT_AMBULATORY_CARE_PROVIDER_SITE_OTHER): Payer: Medicare Other | Admitting: Family Medicine

## 2018-10-13 ENCOUNTER — Encounter: Payer: Self-pay | Admitting: Family Medicine

## 2018-10-13 VITALS — BP 126/76 | HR 64 | Temp 98.0°F | Resp 16 | Ht 69.5 in | Wt 185.0 lb

## 2018-10-13 DIAGNOSIS — Z8679 Personal history of other diseases of the circulatory system: Secondary | ICD-10-CM | POA: Diagnosis not present

## 2018-10-13 DIAGNOSIS — F419 Anxiety disorder, unspecified: Secondary | ICD-10-CM | POA: Diagnosis not present

## 2018-10-13 DIAGNOSIS — I10 Essential (primary) hypertension: Secondary | ICD-10-CM

## 2018-10-13 DIAGNOSIS — Z23 Encounter for immunization: Secondary | ICD-10-CM

## 2018-10-13 DIAGNOSIS — I38 Endocarditis, valve unspecified: Secondary | ICD-10-CM

## 2018-10-13 DIAGNOSIS — E782 Mixed hyperlipidemia: Secondary | ICD-10-CM

## 2018-10-13 DIAGNOSIS — J309 Allergic rhinitis, unspecified: Secondary | ICD-10-CM | POA: Diagnosis not present

## 2018-10-13 DIAGNOSIS — R739 Hyperglycemia, unspecified: Secondary | ICD-10-CM

## 2018-10-13 MED ORDER — FLUTICASONE PROPIONATE 50 MCG/ACT NA SUSP: 2.0000 | Freq: Every day | NASAL | 6 refills | Status: DC

## 2018-10-13 NOTE — Patient Instructions (Signed)
Try Debrox over the counter ear wax removal kit to occasionally remove build up wax in ears.

## 2018-10-13 NOTE — Progress Notes (Signed)
Patient: William Khan, Male    DOB: [birth year], 78 y.o.   MRN: [id number] Visit Date: 10/13/2018  Today's Provider: Wilhemena Durie, MD   Chief Complaint  Patient presents with  . Annual Exam   Subjective:   Patient had AWE on 09/22/2018.    Follow up to William Khan is a 78 y.o. male who presents today for health maintenance . He feels well. He reports exercising not regularly, but he does stay active. He reports he is sleeping well.  Colonoscopy- 04/25/2008. Internal hemorrhoids.     Lipid/Cholesterol, Follow-up:   Last seen for this1 years ago.  Management changes since that visit include no changes. . Last Lipid Panel:    Component Value Date/Time   CHOL 165 09/23/2017 1158   TRIG 137 09/23/2017 1158   HDL 35 (L) 09/23/2017 1158   CHOLHDL 4.7 09/23/2017 1158   LDLCALC 103 (H) 09/23/2017 1158   He reports good compliance with treatment. He is not having side effects.  Current symptoms include none and have been stable. Weight trend: stable Prior visit with dietician: no Current diet: well balanced Current exercise: walking and yard work  [employer] from Last 3 Encounters:  10/13/18 185 lb (83.9 kg)  09/20/17 185 lb (83.9 kg)  09/20/17 185 lb 9.6 oz (84.2 kg)     Review of Systems  Constitutional: Negative for activity change, appetite change, chills, diaphoresis, fatigue and unexpected weight change.  HENT: Positive for congestion and rhinorrhea.   Respiratory: Negative for cough and shortness of breath.   Cardiovascular: Negative for chest pain, palpitations and leg swelling.  Gastrointestinal: Negative.   Endocrine: Negative.   Genitourinary: Negative.   Musculoskeletal: Negative for arthralgias.  Allergic/Immunologic: Negative.   Neurological: Positive for headaches. Negative for dizziness and speech difficulty.       Mild left sided headache.Started with sinus symptoms.     Social History      He  reports that he quit smoking about  28 years ago. He quit after 20.00 years of use. He has never used smokeless tobacco. He reports that he does not drink alcohol or use drugs.       Social History   Socioeconomic History  . Marital status: Married    Spouse name: Not on file  . Number of children: 2  . Years of education: Not on file  . Highest education level: Some college, no degree  Occupational History  . Occupation: retired  Scientific laboratory technician  . Financial resource strain: Not hard at all  . Food insecurity    Worry: Not on file    Inability: Never true  . Transportation needs    Medical: No    Non-medical: No  Tobacco Use  . Smoking status: Former Smoker    Years: 20.00    Quit date: 01/05/1990    Years since quitting: 28.7  . Smokeless tobacco: Never Used  Substance and Sexual Activity  . Alcohol use: No  . Drug use: No  . Sexual activity: Not on file  Lifestyle  . Physical activity    Days per week: 0 days    Minutes per session: 0 min  . Stress: Not at all  Relationships  . Social Herbalist on phone: Patient refused    Gets together: Patient refused    Attends religious service: Patient refused    Active member of club or organization: Patient refused    Attends meetings  of clubs or organizations: Patient refused    Relationship status: Patient refused  Other Topics Concern  . Not on file  Social History Narrative  . Not on file    Past Medical History:  Diagnosis Date  . Hyperlipidemia   . Hypertension      Patient Active Problem List   Diagnosis Date Noted  . ED (erectile dysfunction) of organic origin 06/11/2014  . Essential (primary) hypertension 06/11/2014  . Disorder of mitral valve 06/11/2014  . Combined fat and carbohydrate induced hyperlipemia 06/11/2014  . Anxiety attack 10/17/2013  . History of prolonged Q-T interval on ECG 10/17/2013  . Heart valve disease 10/17/2013  . H/O cardiovascular disorder 10/17/2013  . Personal history of other diseases of the  circulatory system 10/17/2013    Past Surgical History:  Procedure Laterality Date  . SHOULDER SURGERY      Family History        Family Status  Relation Name Status  . Mother  Deceased  . Father  Deceased at age 70       due to heart problems  . Sister  Alive  . Brother  Deceased at age 42 months       due to Pertussis and Measles  . Sister  Alive        His family history includes Alzheimer's disease in his mother; Arthritis in his mother; Hypertension in his mother.      Allergies  Allergen Reactions  . Ace Inhibitors Swelling and Other (See Comments)     Current Outpatient Medications:  .  ALPRAZolam (XANAX) 0.5 MG tablet, Take 0.5 mg by mouth at bedtime as needed. , Disp: , Rfl:  .  betamethasone dipropionate (DIPROLENE) 0.05 % ointment, Apply to affected area BID until clear, Disp: , Rfl:  .  busPIRone (BUSPAR) 30 MG tablet, Take 30 mg by mouth 2 (two) times daily., Disp: , Rfl:  .  Cholecalciferol (VITAMIN D3) 1000 UNITS CAPS, Take 1,000 Units by mouth daily. , Disp: , Rfl:  .  DULoxetine (CYMBALTA) 30 MG capsule, Take 30 mg by mouth daily. , Disp: , Rfl:  .  fluticasone (FLONASE) 50 MCG/ACT nasal spray, USE TWO SPRAY(S) IN EACH NOSTRIL ONCE DAILY (Patient taking differently: Place 2 sprays into both nostrils as needed. ), Disp: 16 g, Rfl: 12 .  loratadine (CLARITIN) 10 MG tablet, Take 1 daily prn itching, Disp: 30 tablet, Rfl: 11 .  LORazepam (ATIVAN) 0.5 MG tablet, Take 0.5 mg by mouth as needed. , Disp: , Rfl:  .  mirtazapine (REMERON) 30 MG tablet, Take 30 mg by mouth at bedtime. , Disp: , Rfl:  .  PARoxetine (PAXIL) 30 MG tablet, Take 30 mg by mouth daily. , Disp: , Rfl:  .  pravastatin (PRAVACHOL) 40 MG tablet, Take 40 mg by mouth daily. , Disp: , Rfl:  .  primidone (MYSOLINE) 50 MG tablet, Take 50 mg by mouth at bedtime. , Disp: , Rfl:  .  propranolol (INDERAL) 20 MG tablet, Take 20 mg by mouth 2 (two) times daily., Disp: , Rfl:  .  QUEtiapine (SEROQUEL) 50 MG  tablet, , Disp: , Rfl:  .  risperiDONE (RISPERDAL) 1 MG tablet, Take 1 mg by mouth daily. , Disp: , Rfl:  .  temazepam (RESTORIL) 15 MG capsule, Take 15 mg by mouth at bedtime as needed. , Disp: , Rfl:  .  traZODone (DESYREL) 50 MG tablet, Take 1 tablet (50 mg total) by mouth at bedtime as needed  for sleep. (Patient not taking: Reported on 09/20/2017), Disp: 30 tablet, Rfl: 12   Patient Care Team: Jerrol Banana., MD as PCP - General (Family Medicine) Anabel Bene, MD as Referring Physician (Neurology) Jannifer Franklin, NP as Nurse Practitioner (Neurology) Corey Skains, MD as Consulting Physician (Cardiology)    Objective:    Vitals: BP 126/76   Pulse 64   Temp 98 F (36.7 C)   Resp 16   Ht 5' 9.5" (1.765 m)   Wt 185 lb (83.9 kg)   SpO2 97%   BMI 26.93 kg/m    Vitals:   10/13/18 1517  BP: 126/76  Pulse: 64  Resp: 16  Temp: 98 F (36.7 C)  SpO2: 97%  Weight: 185 lb (83.9 kg)  Height: 5' 9.5" (1.765 m)     Physical Exam   Depression Screen PHQ 2/9 Scores 09/22/2018 09/20/2017 09/16/2016 06/21/2014  PHQ - 2 Score 0 0 0 0  PHQ- 9 Score - 1 - -       Assessment & Plan:     Routine Health Maintenance and Physical Exam  Exercise Activities and Dietary recommendations Goals    . Increase water intake     Recommend increasing water intake to 4-6 glasses a day.    Marland [employer] LIFESTYLE - DECREASE FALLS RISK     Recommend to remove any items from the home that may cause slips or trips.       Immunization History  Administered Date(s) Administered  . Influenza, High Dose Seasonal PF 11/14/2015, 10/22/2016  . Influenza-Unspecified 10/05/2012, 09/15/2017  . Pneumococcal Conjugate-13 12/04/2013  . Pneumococcal Polysaccharide-23 10/14/2006  . Td 03/10/2007  . Zoster Recombinat (Shingrix) 08/04/2018    Health Maintenance  Topic Date Due  . Samul Dada  03/09/2017  . INFLUENZA VACCINE  08/06/2018  . PNA vac Low Risk Adult  Completed     Discussed  health benefits of physical activity, and encouraged him to engage in regular exercise appropriate for his age and condition.  1. Essential (primary) hypertension  - CBC with Differential/Platelet - Comprehensive metabolic panel - TSH  2. Flu vaccine need  - Flu Vaccine QUAD High Dose(Fluad)  3. Heart valve disease   4. H/O cardiovascular disorder   5. Hyperglycemia  - Hemoglobin A1c  6. Combined fat and carbohydrate induced hyperlipemia On pravastatin. - Lipid panel  7. Allergic rhinitis, unspecified seasonality, unspecified trigger And fluticasone nasal spray.  Work-up further as indicated - fluticasone (FLONASE) 50 MCG/ACT nasal spray; Place 2 sprays into both nostrils daily.  Dispense: 16 g; Refill: 6  8. Anxiety Per psychiatry. 9.Cerumen of left ear Better after irrigation. Try Debrox.   --------------------------------------------------------------------    Wilhemena Durie, MD  [facility]

## 2018-10-14 DIAGNOSIS — R739 Hyperglycemia, unspecified: Secondary | ICD-10-CM | POA: Diagnosis not present

## 2018-10-14 DIAGNOSIS — E782 Mixed hyperlipidemia: Secondary | ICD-10-CM | POA: Diagnosis not present

## 2018-10-14 DIAGNOSIS — I10 Essential (primary) hypertension: Secondary | ICD-10-CM | POA: Diagnosis not present

## 2018-10-15 LAB — COMPREHENSIVE METABOLIC PANEL
ALT: 21 IU/L (ref 0–44)
AST: 20 IU/L (ref 0–40)
Albumin/Globulin Ratio: 1.9 (ref 1.2–2.2)
Albumin: 4.3 g/dL (ref 3.7–4.7)
Alkaline Phosphatase: 102 IU/L (ref 39–117)
BUN/Creatinine Ratio: 12 (ref 10–24)
BUN: 11 mg/dL (ref 8–27)
Bilirubin Total: 0.3 mg/dL (ref 0.0–1.2)
CO2: 24 mmol/L (ref 20–29)
Calcium: 9.3 mg/dL (ref 8.6–10.2)
Chloride: 98 mmol/L (ref 96–106)
Creatinine, Ser: 0.9 mg/dL (ref 0.76–1.27)
GFR calc Af Amer: 93 mL/min/{1.73_m2} (ref >59)
GFR calc non Af Amer: 80 mL/min/{1.73_m2} (ref >59)
Globulin, Total: 2.3 g/dL (ref 1.5–4.5)
Glucose: 114 mg/dL — ABNORMAL HIGH (ref 65–99)
Potassium: 5.2 mmol/L (ref 3.5–5.2)
Sodium: 136 mmol/L (ref 134–144)
Total Protein: 6.6 g/dL (ref 6.0–8.5)

## 2018-10-15 LAB — CBC WITH DIFFERENTIAL/PLATELET
Basophils Absolute: 0.1 10*3/uL (ref 0.0–0.2)
Basos: 2 %
EOS (ABSOLUTE): 0.4 10*3/uL (ref 0.0–0.4)
Eos: 7 %
Hematocrit: 42.6 % (ref 37.5–51.0)
Hemoglobin: 14.7 g/dL (ref 13.0–17.7)
Immature Grans (Abs): 0 10*3/uL (ref 0.0–0.1)
Immature Granulocytes: 1 %
Lymphocytes Absolute: 1.7 10*3/uL (ref 0.7–3.1)
Lymphs: 29 %
MCH: 31.8 pg (ref 26.6–33.0)
MCHC: 34.5 g/dL (ref 31.5–35.7)
MCV: 92 fL (ref 79–97)
Monocytes Absolute: 0.6 10*3/uL (ref 0.1–0.9)
Monocytes: 10 %
Neutrophils Absolute: 3 10*3/uL (ref 1.4–7.0)
Neutrophils: 51 %
Platelets: 171 10*3/uL (ref 150–450)
RBC: 4.62 x10E6/uL (ref 4.14–5.80)
RDW: 13 % (ref 11.6–15.4)
WBC: 5.8 10*3/uL (ref 3.4–10.8)

## 2018-10-15 LAB — LIPID PANEL
Chol/HDL Ratio: 4.8 ratio (ref 0.0–5.0)
Cholesterol, Total: 184 mg/dL (ref 100–199)
HDL: 38 mg/dL — ABNORMAL LOW (ref >39)
LDL Chol Calc (NIH): 120 mg/dL — ABNORMAL HIGH (ref 0–99)
Triglycerides: 147 mg/dL (ref 0–149)
VLDL Cholesterol Cal: 26 mg/dL (ref 5–40)

## 2018-10-15 LAB — HEMOGLOBIN A1C
Est. average glucose Bld gHb Est-mCnc: 126 mg/dL
Hgb A1c MFr Bld: 6 % — ABNORMAL HIGH (ref 4.8–5.6)

## 2018-10-15 LAB — TSH: TSH: 10.6 u[IU]/mL — ABNORMAL HIGH (ref 0.450–4.500)

## 2018-10-20 ENCOUNTER — Telehealth: Payer: Self-pay

## 2018-10-20 DIAGNOSIS — E039 Hypothyroidism, unspecified: Secondary | ICD-10-CM

## 2018-10-20 MED ORDER — LEVOTHYROXINE SODIUM 50 MCG PO TABS: 50.0000 ug | ORAL_TABLET | Freq: Every day | ORAL | 1 refills | Status: DC

## 2018-10-20 NOTE — Telephone Encounter (Signed)
-----   Message from Jerrol Banana., MD sent at 10/20/2018  9:09 AM EDT ----- Labs stable but mildly hypothyroid.  Would start Synthroid 50 mcg daily recheck in the spring

## 2018-10-20 NOTE — Telephone Encounter (Signed)
Patient notified of lab results and starting new medication. Medication sent to the pharmacy.

## 2018-10-25 DIAGNOSIS — R251 Tremor, unspecified: Secondary | ICD-10-CM | POA: Diagnosis not present

## 2018-10-25 DIAGNOSIS — G479 Sleep disorder, unspecified: Secondary | ICD-10-CM | POA: Diagnosis not present

## 2018-10-25 DIAGNOSIS — F411 Generalized anxiety disorder: Secondary | ICD-10-CM | POA: Diagnosis not present

## 2019-01-12 IMAGING — MR MR CERVICAL SPINE WO/W CM
8 series · 44 of 48 positions shown · IV contrast (multihance)
Comparison: Brain MRI 02/15/2016.

CLINICAL DATA: 75-year-old male with balance issues,
lightheadedness for 3 months, evidence of C3-C4 spinal stenosis with
spinal cord mass effect on recent brain MRI. Subsequent encounter.

EXAM:
MRI CERVICAL SPINE WITHOUT AND WITH CONTRAST
TECHNIQUE: Multiplanar and multiecho pulse sequences of the cervical spine, to
include the craniocervical junction and cervicothoracic junction,
were obtained without and with intravenous contrast.
CONTRAST:  15mL MULTIHANCE GADOBENATE DIMEGLUMINE 529 MG/ML IV SOLN

[Series 3: T2 · sagittal · 3.0mm · 0.70mm/px · 5 of 17 slices shown (1 of 2)]
[im 1/17]
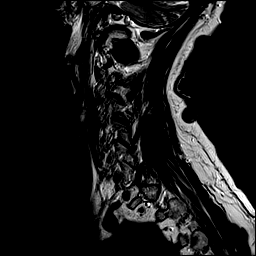
[im 5/17]
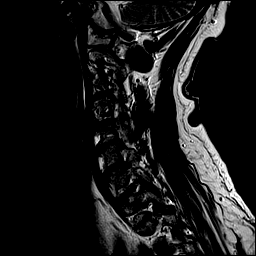
[im 9/17]
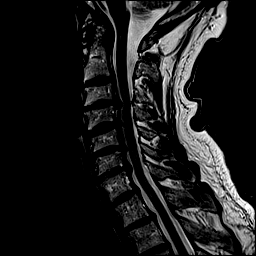
[im 13/17]
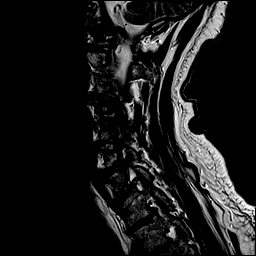
[im 17/17]
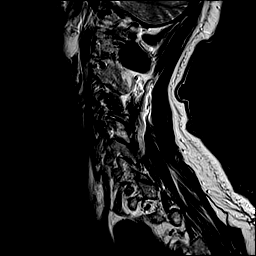

[Series 4: T1 · sagittal · 3.0mm · 0.70mm/px · 5 of 17 slices shown (1 of 2)]
[im 1/17]
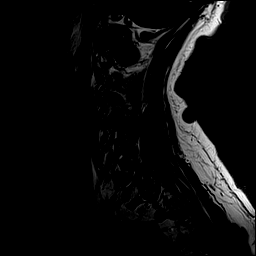
[im 5/17]
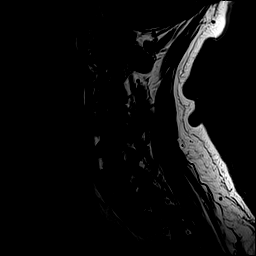
[im 9/17]
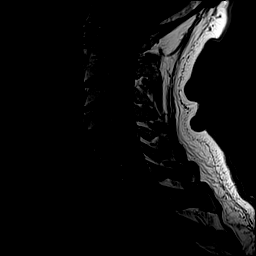
[im 13/17]
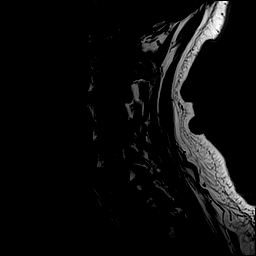
[im 17/17]
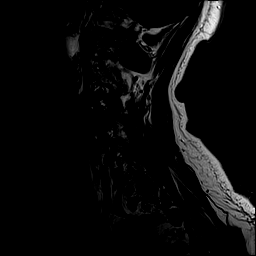

[Series 6: STIR · sagittal · 3.0mm · 0.70mm/px · 5 of 17 slices shown]
[im 1/17]
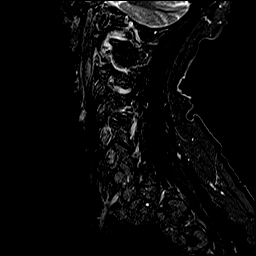
[im 5/17]
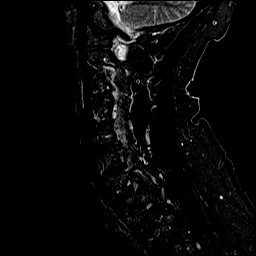
[im 9/17]
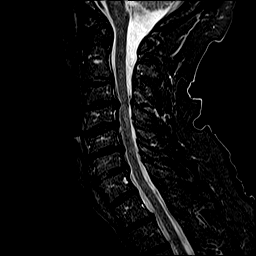
[im 13/17]
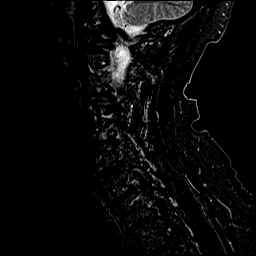
[im 17/17]
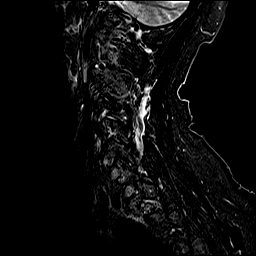

[Series 7: T2 · axial · 3.0mm · 0.70mm/px · z∈[-22,+79]mm · 7 of 27 slices shown (2 of 2)]
[im 1/27]
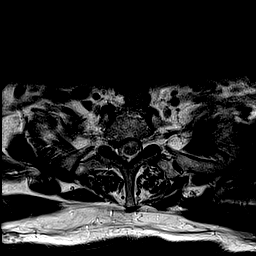
[im 5/27]
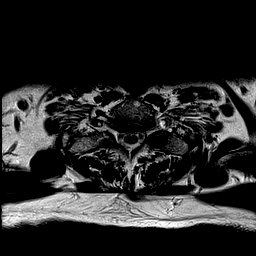
[im 9/27]
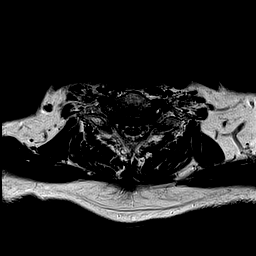
[im 14/27]
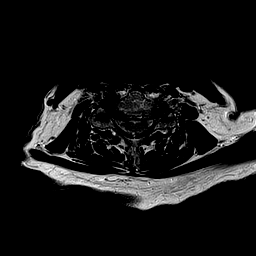
[im 18/27]
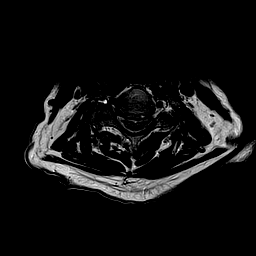
[im 22/27]
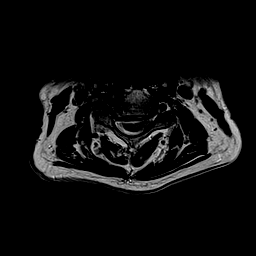
[im 27/27]
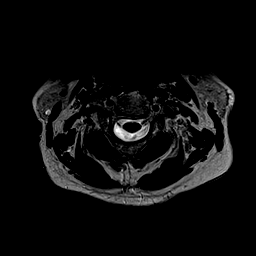

[Series 8: mpgr ax · axial · 3.0mm · 0.35mm/px · z∈[-22,+9]mm · 3 of 27 slices shown]
[im 1/27]
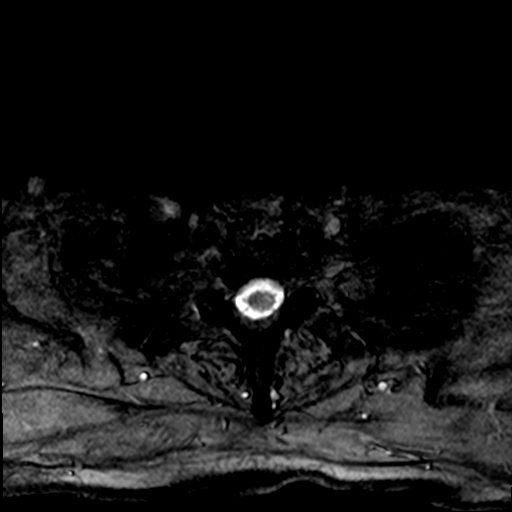
[im 5/27]
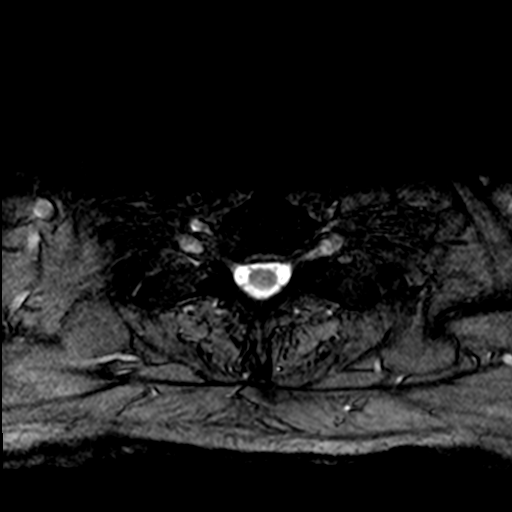
[im 9/27]
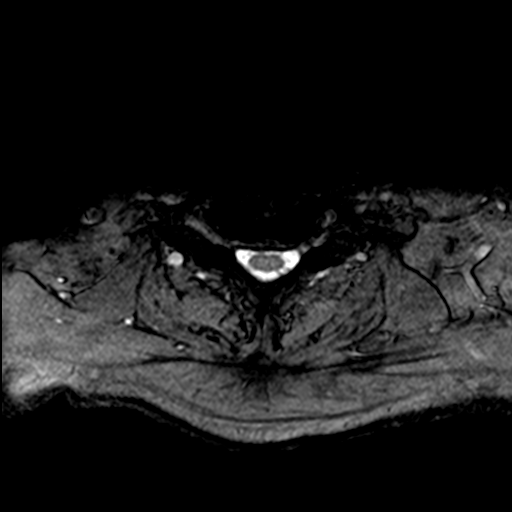

[Series 9: T1 · axial · 3.0mm · 0.56mm/px · z∈[-22,+79]mm · 7 of 27 slices shown (2 of 2)]
[im 1/27]
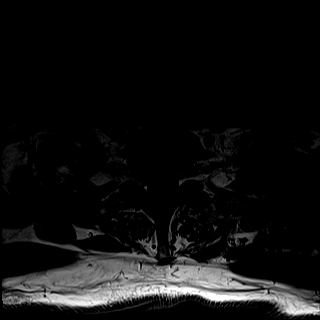
[im 5/27]
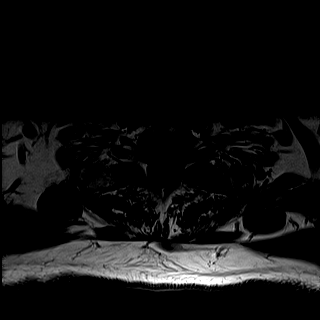
[im 9/27]
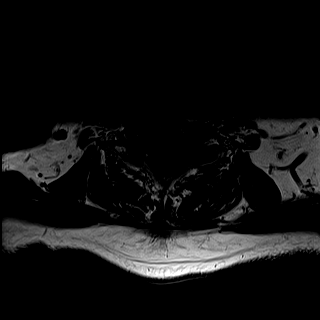
[im 14/27]
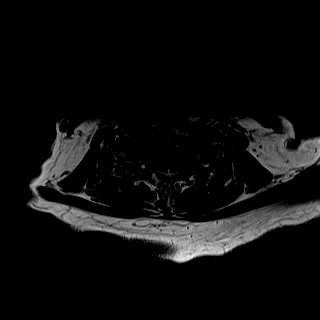
[im 18/27]
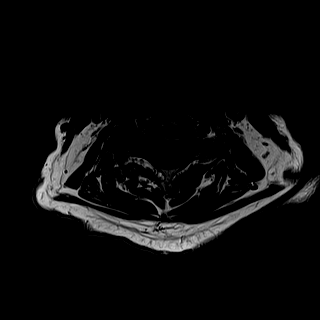
[im 22/27]
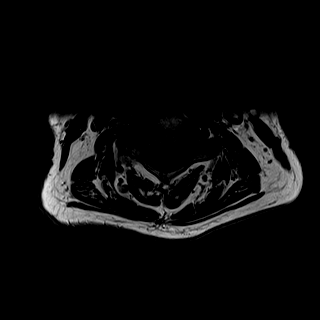
[im 27/27]
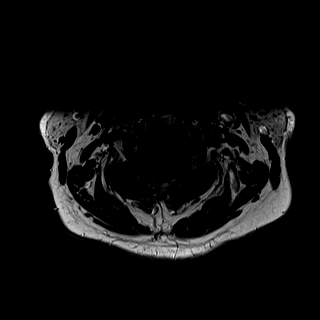

[Series 10: T1 fat-sat post-contrast · sagittal · 3.0mm · 0.70mm/px · 5 of 17 slices shown]
[im 1/17]
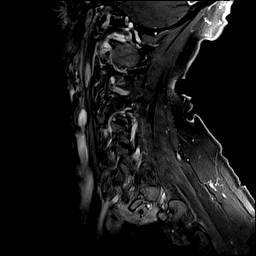
[im 5/17]
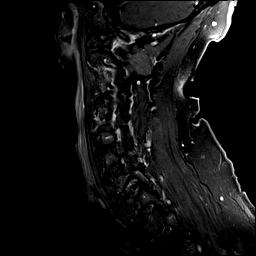
[im 9/17]
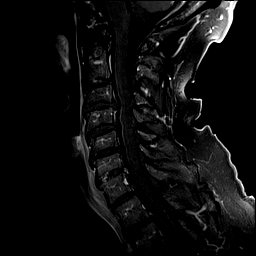
[im 13/17]
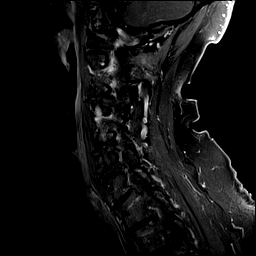
[im 17/17]
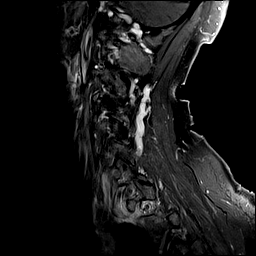

[Series 11: T1 post-contrast · axial · 3.0mm · 0.56mm/px · z∈[-22,+79]mm · 7 of 27 slices shown]
[im 1/27]
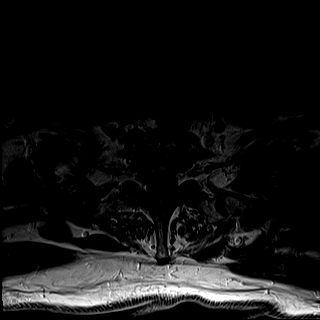
[im 5/27]
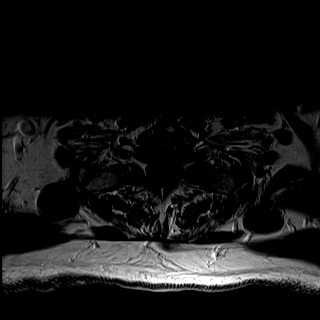
[im 9/27]
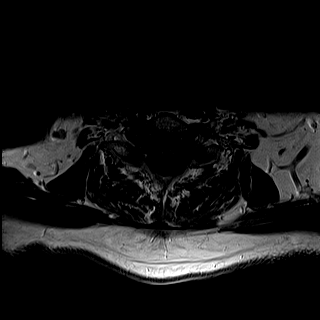
[im 14/27]
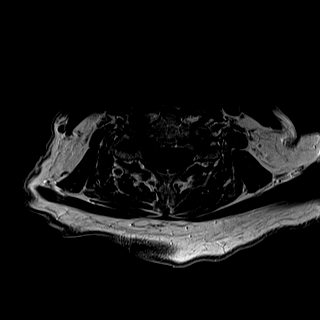
[im 18/27]
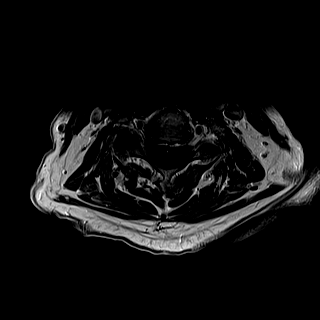
[im 22/27]
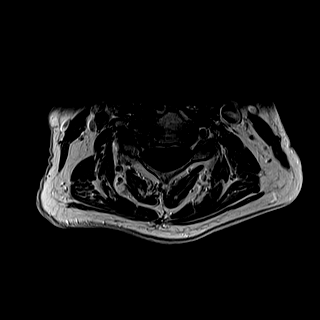
[im 27/27]
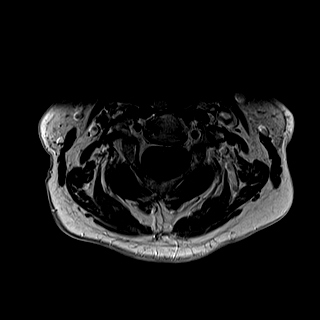

[44 of 48 positions shown; findings below may reference images not displayed]

FINDINGS: Alignment: Relatively preserved cervical lordosis. There is trace
anterolisthesis of C7 on T1.

Vertebrae: No marrow edema or evidence of acute osseous abnormality.
Visualized bone marrow signal is within normal limits.

Cord: Spinal cord signal is within normal limits at all visualized
levels. No abnormal intradural enhancement.

Posterior Fossa, vertebral arteries, paraspinal tissues:
Cervicomedullary junction is within normal limits.

Evidence of left ICA occlusion at its origin with loss of the flow
void on series 7, image 8. This corresponds to absent distal
cervical and left ICA flow void on the recent brain. Other major
vascular flow voids in the neck are preserved. No acute neck soft
tissue findings.

Disc levels:

C2-C3: Right posterior element ankylosis, and probably also a right
lateral interbody ankylosis. No stenosis.

C3-C4: Disc space loss with bulky right eccentric disc bulge and
endplate osteophytosis. Superimposed right greater than left
uncovertebral hypertrophy. Spinal stenosis with mass effect on the
central and right hemi cord (series 8, image 8). Moderate to severe
left and severe right C4 foraminal stenosis (series 8, image 7).

C4-C5: Circumferential disc bulge with broad-based slightly left
eccentric posterior component (series 8, image 11). Mild to moderate
facet hypertrophy on the right. Trace right facet joint fluid. Mild
uncovertebral hypertrophy. Spinal stenosis with mild left hemi cord
mass effect. Moderate to severe right C5 foraminal stenosis.

C5-C6: Disc space loss with circumferential disc bulge and endplate
spurring. Broad-based posterior component (series 8, image 16).
Spinal stenosis with no definite spinal cord mass effect.
Uncovertebral hypertrophy with severe bilateral C6 foraminal
stenosis.

C6-C7: Disc space loss with circumferential disc bulge and
broad-based posterior component (series 8, image 19). Mild ligament
flavum hypertrophy. No significant spinal stenosis. Foraminal disc
and/or uncovertebral hypertrophy. Moderate to severe bilateral C7
foraminal stenosis worse on the right.

C7-T1: Broad-based mild disc bulge (series 8, image 23). Mild
ligament flavum hypertrophy. No spinal stenosis. Mild to moderate
facet hypertrophy. Mild uncovertebral hypertrophy. Mild to moderate
left and borderline to mild right C8 foraminal stenosis.

No significant upper thoracic spinal stenosis despite disc and
posterior element degeneration.
IMPRESSION: 1. Ankylosis of the C2-C3 level (on the right) with severe disc and
endplate degeneration at the adjacent C3-C4 level resulting in
spinal stenosis with mild spinal cord mass effect.
2. Degenerative spinal stenosis with up to mild cord mass effect
also at C4-C5 and C5-C6. There is associated spinal cord signal
abnormality.
3. Degenerative moderate or severe neural foraminal stenosis at the
right greater than left C4, right C5, bilateral C6, and right
greater than left C7 nerve levels.
4. Chronic occlusion of the left ICA at its origin.

## 2019-01-17 ENCOUNTER — Telehealth: Payer: Self-pay | Admitting: Family Medicine

## 2019-01-18 NOTE — Telephone Encounter (Signed)
Called the [place] [facility] pharmacy and gave verbal consent to change manufacturers for the levothyroxine 35mcg tablet.

## 2019-03-09 ENCOUNTER — Other Ambulatory Visit: Payer: Self-pay | Admitting: Family Medicine

## 2019-03-09 DIAGNOSIS — J309 Allergic rhinitis, unspecified: Secondary | ICD-10-CM

## 2019-03-09 NOTE — Telephone Encounter (Signed)
Requested medication (s) are due for refill today: yes  Requested medication (s) are on the active medication list:yes  Last refill:  01/31/18  Future visit scheduled: yes  Notes to clinic:  Expired RX    Requested Prescriptions  Pending Prescriptions Disp Refills   loratadine (CLARITIN) 10 MG tablet [Pharmacy Med Name: EQ LORATADINE 10MG  TAB] 90 tablet 0    Sig: TAKE 1 TABLET BY MOUTH ONCE DAILY AS NEEDED FOR ITCHING      Ear, Nose, and Throat:  Antihistamines Passed - 03/09/2019  3:11 PM      Passed - Valid encounter within last 12 months    Recent Outpatient Visits           4 months ago Essential (primary) hypertension   [facility] Jerrol Banana., MD   1 year ago Essential (primary) hypertension   [facility] Jerrol Banana., MD   2 years ago Essential (primary) hypertension   [facility] Jerrol Banana., MD   4 years ago Essential (primary) hypertension   [facility] Jerrol Banana., MD   4 years ago Medicare annual wellness visit, subsequent   [facility] Jerrol Banana., MD

## 2019-03-27 ENCOUNTER — Other Ambulatory Visit: Payer: Self-pay | Admitting: Family Medicine

## 2019-03-27 DIAGNOSIS — J309 Allergic rhinitis, unspecified: Secondary | ICD-10-CM

## 2019-03-27 DIAGNOSIS — E039 Hypothyroidism, unspecified: Secondary | ICD-10-CM

## 2019-03-27 MED ORDER — LEVOTHYROXINE SODIUM 50 MCG PO TABS: 50.0000 ug | ORAL_TABLET | Freq: Every day | ORAL | 1 refills | Status: DC

## 2019-03-27 MED ORDER — LORATADINE 10 MG PO TABS: ORAL_TABLET | ORAL | 1 refills | Status: DC

## 2019-03-27 NOTE — Telephone Encounter (Signed)
Requested Prescriptions  Pending Prescriptions Disp Refills  . levothyroxine (SYNTHROID) 50 MCG tablet 90 tablet 1    Sig: Take 1 tablet (50 mcg total) by mouth daily.     Endocrinology:  Hypothyroid Agents Failed - 03/27/2019 12:51 PM      Failed - TSH needs to be rechecked within 3 months after an abnormal result. Refill until TSH is due.      Failed - TSH in normal range and within 360 days    TSH  Date Value Ref Range Status  10/14/2018 10.600 (H) 0.450 - 4.500 uIU/mL Final         Passed - Valid encounter within last 12 months    Recent Outpatient Visits          5 months ago Essential (primary) hypertension   [facility] Jerrol Banana., MD   1 year ago Essential (primary) hypertension   [facility] Jerrol Banana., MD   2 years ago Essential (primary) hypertension   [facility] Jerrol Banana., MD   4 years ago Essential (primary) hypertension   [facility] Jerrol Banana., MD   4 years ago Medicare annual wellness visit, subsequent   [facility] Jerrol Banana., MD             . loratadine (CLARITIN) 10 MG tablet 90 tablet 1    Sig: TAKE 1 TABLET BY MOUTH ONCE DAILY AS NEEDED FOR ITCHING     Ear, Nose, and Throat:  Antihistamines Passed - 03/27/2019 12:51 PM      Passed - Valid encounter within last 12 months    Recent Outpatient Visits          5 months ago Essential (primary) hypertension   [facility] Jerrol Banana., MD   1 year ago Essential (primary) hypertension   [facility] Jerrol Banana., MD   2 years ago Essential (primary) hypertension   [facility] Jerrol Banana., MD   4 years ago Essential (primary) hypertension   [name] Jerrol Banana., MD   4 years ago Medicare annual wellness visit, subsequent   [facility] Jerrol Banana., MD

## 2019-03-27 NOTE — Telephone Encounter (Signed)
Medication Refill: loratadine (CLARITIN) 10 MG tablet [id number] levothyroxine (SYNTHROID) 50 MCG tablet [id number]      Pharmacy:  [place], [place] Phone:  [phone]  Fax:  [phone]        Pt aware of turn around time

## 2019-04-25 DIAGNOSIS — R251 Tremor, unspecified: Secondary | ICD-10-CM | POA: Diagnosis not present

## 2019-05-23 DIAGNOSIS — E782 Mixed hyperlipidemia: Secondary | ICD-10-CM | POA: Diagnosis not present

## 2019-05-23 DIAGNOSIS — R001 Bradycardia, unspecified: Secondary | ICD-10-CM | POA: Diagnosis not present

## 2019-05-23 DIAGNOSIS — R0602 Shortness of breath: Secondary | ICD-10-CM | POA: Diagnosis not present

## 2019-05-23 DIAGNOSIS — I10 Essential (primary) hypertension: Secondary | ICD-10-CM | POA: Diagnosis not present

## 2019-05-23 DIAGNOSIS — I059 Rheumatic mitral valve disease, unspecified: Secondary | ICD-10-CM | POA: Diagnosis not present

## 2019-05-23 DIAGNOSIS — R251 Tremor, unspecified: Secondary | ICD-10-CM | POA: Diagnosis not present

## 2019-06-22 DIAGNOSIS — R0602 Shortness of breath: Secondary | ICD-10-CM | POA: Diagnosis not present

## 2019-06-22 DIAGNOSIS — R001 Bradycardia, unspecified: Secondary | ICD-10-CM | POA: Diagnosis not present

## 2019-06-29 DIAGNOSIS — R0602 Shortness of breath: Secondary | ICD-10-CM | POA: Diagnosis not present

## 2019-06-29 DIAGNOSIS — I10 Essential (primary) hypertension: Secondary | ICD-10-CM | POA: Diagnosis not present

## 2019-06-29 DIAGNOSIS — R001 Bradycardia, unspecified: Secondary | ICD-10-CM | POA: Diagnosis not present

## 2019-06-29 DIAGNOSIS — I059 Rheumatic mitral valve disease, unspecified: Secondary | ICD-10-CM | POA: Diagnosis not present

## 2019-07-04 DIAGNOSIS — M5441 Lumbago with sciatica, right side: Secondary | ICD-10-CM | POA: Diagnosis not present

## 2019-07-04 DIAGNOSIS — M461 Sacroiliitis, not elsewhere classified: Secondary | ICD-10-CM | POA: Diagnosis not present

## 2019-07-04 DIAGNOSIS — M9903 Segmental and somatic dysfunction of lumbar region: Secondary | ICD-10-CM | POA: Diagnosis not present

## 2019-07-04 DIAGNOSIS — M9904 Segmental and somatic dysfunction of sacral region: Secondary | ICD-10-CM | POA: Diagnosis not present

## 2019-07-11 DIAGNOSIS — M9903 Segmental and somatic dysfunction of lumbar region: Secondary | ICD-10-CM | POA: Diagnosis not present

## 2019-07-11 DIAGNOSIS — M9904 Segmental and somatic dysfunction of sacral region: Secondary | ICD-10-CM | POA: Diagnosis not present

## 2019-07-11 DIAGNOSIS — M5441 Lumbago with sciatica, right side: Secondary | ICD-10-CM | POA: Diagnosis not present

## 2019-07-11 DIAGNOSIS — M461 Sacroiliitis, not elsewhere classified: Secondary | ICD-10-CM | POA: Diagnosis not present

## 2019-07-18 DIAGNOSIS — M9904 Segmental and somatic dysfunction of sacral region: Secondary | ICD-10-CM | POA: Diagnosis not present

## 2019-07-18 DIAGNOSIS — M5441 Lumbago with sciatica, right side: Secondary | ICD-10-CM | POA: Diagnosis not present

## 2019-07-18 DIAGNOSIS — M9903 Segmental and somatic dysfunction of lumbar region: Secondary | ICD-10-CM | POA: Diagnosis not present

## 2019-07-18 DIAGNOSIS — M461 Sacroiliitis, not elsewhere classified: Secondary | ICD-10-CM | POA: Diagnosis not present

## 2019-08-08 DIAGNOSIS — M5441 Lumbago with sciatica, right side: Secondary | ICD-10-CM | POA: Diagnosis not present

## 2019-08-08 DIAGNOSIS — M9904 Segmental and somatic dysfunction of sacral region: Secondary | ICD-10-CM | POA: Diagnosis not present

## 2019-08-08 DIAGNOSIS — M461 Sacroiliitis, not elsewhere classified: Secondary | ICD-10-CM | POA: Diagnosis not present

## 2019-08-08 DIAGNOSIS — M9903 Segmental and somatic dysfunction of lumbar region: Secondary | ICD-10-CM | POA: Diagnosis not present

## 2019-09-01 ENCOUNTER — Other Ambulatory Visit: Payer: Self-pay | Admitting: Family Medicine

## 2019-09-01 DIAGNOSIS — E039 Hypothyroidism, unspecified: Secondary | ICD-10-CM

## 2019-09-01 DIAGNOSIS — J309 Allergic rhinitis, unspecified: Secondary | ICD-10-CM

## 2019-10-12 NOTE — Progress Notes (Signed)
Subjective:   William Khan is a 79 y.o. male who presents for Medicare Annual/Subsequent preventive examination.  Review of Systems    N/A  Cardiac Risk Factors include: advanced age (>66men, >39 women);dyslipidemia;male gender;hypertension     Objective:    Today's Vitals   10/16/19 0832  BP: 140/76  Pulse: 75  Temp: 97.7 F (36.5 C)  TempSrc: Oral  SpO2: 98%  Weight: 180 lb 6.4 oz (81.8 kg)  Height: 5\' 9"  (1.753 m)  PainSc: 0-No pain   Body mass index is 26.64 kg/m.  Advanced Directives 10/16/2019 09/22/2018 09/20/2017 09/16/2016 05/25/2016  Does Patient Have a Medical Advance Directive? No No No No No  Would patient like information on creating a medical advance directive? No - Patient declined No - Patient declined No - Patient declined No - Patient declined No - Patient declined    Current Medications (verified) Outpatient Encounter Medications as of 10/16/2019  Medication Sig  . betamethasone dipropionate (DIPROLENE) 0.05 % ointment Apply to affected area BID until clear  . busPIRone (BUSPAR) 30 MG tablet Take 30 mg by mouth 2 (two) times daily.  . Cholecalciferol (VITAMIN D3) 1000 UNITS CAPS Take 1,000 Units by mouth daily.   . DULoxetine (CYMBALTA) 30 MG capsule Take 30 mg by mouth daily.   . fluticasone (FLONASE) 50 MCG/ACT nasal spray USE TWO SPRAY(S) IN EACH NOSTRIL ONCE DAILY (Patient taking differently: Place 2 sprays into both nostrils as needed. )  . levothyroxine (SYNTHROID) 50 MCG tablet TAKE 1 TABLET EVERY DAY  . loratadine (CLARITIN) 10 MG tablet TAKE 1 TABLET EVERY DAY AS NEEDED FOR ITCHING  . LORazepam (ATIVAN) 0.5 MG tablet Take 0.5 mg by mouth as needed.   . mirtazapine (REMERON) 30 MG tablet Take 30 mg by mouth at bedtime.   . pravastatin (PRAVACHOL) 40 MG tablet Take 40 mg by mouth daily.   . primidone (MYSOLINE) 50 MG tablet Take 50 mg by mouth at bedtime.   . temazepam (RESTORIL) 15 MG capsule Take 15 mg by mouth at bedtime as needed.   .  ALPRAZolam (XANAX) 0.5 MG tablet Take 0.5 mg by mouth at bedtime as needed.  (Patient not taking: Reported on 10/16/2019)  . fluticasone (FLONASE) 50 MCG/ACT nasal spray Place 2 sprays into both nostrils daily. (Patient not taking: Reported on 10/16/2019)  . PARoxetine (PAXIL) 30 MG tablet Take 30 mg by mouth daily.  (Patient not taking: Reported on 10/16/2019)  . propranolol (INDERAL) 20 MG tablet Take 20 mg by mouth 2 (two) times daily. (Patient not taking: Reported on 10/16/2019)  . QUEtiapine (SEROQUEL) 50 MG tablet  (Patient not taking: Reported on 10/16/2019)  . risperiDONE (RISPERDAL) 1 MG tablet Take 1 mg by mouth daily.   . traZODone (DESYREL) 50 MG tablet Take 1 tablet (50 mg total) by mouth at bedtime as needed for sleep. (Patient not taking: Reported on 09/20/2017)   No facility-administered encounter medications on file as of 10/16/2019.    Allergies (verified) Ace inhibitors   History: Past Medical History:  Diagnosis Date  . Hyperlipidemia   . Hypertension    Past Surgical History:  Procedure Laterality Date  . SHOULDER SURGERY     Family History  Problem Relation Age of Onset  . Arthritis Mother   . Hypertension Mother   . Alzheimer's disease Mother    Social History   Socioeconomic History  . Marital status: Married    Spouse name: Not on file  . Number of children: 2  .  Years of education: Not on file  . Highest education level: Some college, no degree  Occupational History  . Occupation: retired  Tobacco Use  . Smoking status: Former Smoker    Years: 20.00    Quit date: 01/05/1990    Years since quitting: 29.7  . Smokeless tobacco: Never Used  Vaping Use  . Vaping Use: Never used  Substance and Sexual Activity  . Alcohol use: No  . Drug use: No  . Sexual activity: Not on file  Other Topics Concern  . Not on file  Social History Narrative  . Not on file   Social Determinants of Health   Financial Resource Strain: Low Risk   . Difficulty of  Paying Living Expenses: Not hard at all  Food Insecurity: No Food Insecurity  . Worried About Charity fundraiser in the Last Year: Never true  . Ran Out of Food in the Last Year: Never true  Transportation Needs: No Transportation Needs  . Lack of Transportation (Medical): No  . Lack of Transportation (Non-Medical): No  Physical Activity: Insufficiently Active  . Days of Exercise per Week: 2 days  . Minutes of Exercise per Session: 10 min  Stress: No Stress Concern Present  . Feeling of Stress : Not at all  Social Connections: Moderately Isolated  . Frequency of Communication with Friends and Family: More than three times a week  . Frequency of Social Gatherings with Friends and Family: Twice a week  . Attends Religious Services: Never  . Active Member of Clubs or Organizations: No  . Attends Archivist Meetings: Never  . Marital Status: Married    Tobacco Counseling Counseling given: Not Answered   Clinical Intake:  Pre-visit preparation completed: Yes  Pain : No/denies pain Pain Score: 0-No pain     Nutritional Status: BMI 25 -29 Overweight Nutritional Risks: None Diabetes: No  How often do you need to have someone help you when you read instructions, pamphlets, or other written materials from your doctor or pharmacy?: 1 - Never  Diabetic? No  Interpreter Needed?: No  Information entered by :: [facility] LPN   Activities of Daily Living In your present state of health, do you have any difficulty performing the following activities: 10/16/2019  Hearing? N  Vision? N  Difficulty concentrating or making decisions? N  Walking or climbing stairs? N  Dressing or bathing? N  Doing errands, shopping? N  Preparing Food and eating ? N  Using the Toilet? N  In the past six months, have you accidently leaked urine? Y  Comment Occasionally with urges.  Do you have problems with loss of bowel control? N  Managing your Medications? N  Managing your Finances?  N  Housekeeping or managing your Housekeeping? N  Some recent data might be hidden    Patient Care Team: Jerrol Banana., MD as PCP - General (Family Medicine) Jannifer Franklin, NP as Nurse Practitioner (Neurology) Corey Skains, MD as Consulting Physician (Cardiology) Pa, [facility])  Indicate any recent Medical Services you may have received from other than [facility] providers in the past year (date may be approximate).     Assessment:   This is a routine wellness examination for Bertin.  Hearing/Vision screen No exam data present  Dietary issues and exercise activities discussed: Current Exercise Habits: Home exercise routine, Type of exercise: treadmill;walking, Time (Minutes): 15, Frequency (Times/Week): 2, Weekly Exercise (Minutes/Week): 30, Intensity: Mild, Exercise limited by: None identified  Goals    .  LIFESTYLE - DECREASE FALLS RISK     Recommend to remove any items from the home that may cause slips or trips.      Depression Screen PHQ 2/9 Scores 10/16/2019 09/22/2018 09/20/2017 09/16/2016 06/21/2014  PHQ - 2 Score 0 0 0 0 0  PHQ- 9 Score - - 1 - -    Fall Risk Fall Risk  10/16/2019 10/13/2018 09/22/2018 09/20/2017 09/16/2016  Falls in the past year? 1 0 1 Yes Yes  Number falls in past yr: 1 0 0 1 2 or more  Injury with Fall? 0 0 0 No No  Risk for fall due to : Impaired balance/gait - - - -  Follow up Falls prevention discussed Falls evaluation completed Falls prevention discussed Falls prevention discussed Falls prevention discussed    Any stairs in or around the home? Yes  If so, are there any without handrails? No  Home free of loose throw rugs in walkways, pet beds, electrical cords, etc? Yes  Adequate lighting in your home to reduce risk of falls? Yes   ASSISTIVE DEVICES UTILIZED TO PREVENT FALLS:  Life alert? No  Use of a cane, walker or w/c? No  Grab bars in the bathroom? No  Shower chair or bench in shower? Yes  Elevated toilet  seat or a handicapped toilet? No   TIMED UP AND GO:  Was the test performed? Yes .  Length of time to ambulate 10 feet: 9 sec.   Gait steady and fast without use of assistive device  Cognitive Function: Declined today.      6CIT Screen 09/16/2016  What Year? 0 points  What month? 0 points  What time? 0 points  Count back from 20 0 points  Months in reverse 0 points  Repeat phrase 6 points  Total Score 6    Immunizations Immunization History  Administered Date(s) Administered  . Fluad Quad(high Dose 65+) 10/13/2018  . Influenza, High Dose Seasonal PF 11/14/2015, 10/22/2016, 10/11/2019  . Influenza-Unspecified 10/05/2012, 09/15/2017  . PFIZER SARS-COV-2 Vaccination 01/11/2019, 02/04/2019  . Pneumococcal Conjugate-13 12/04/2013  . Pneumococcal Polysaccharide-23 10/14/2006  . Td 03/10/2007  . Zoster Recombinat (Shingrix) 08/04/2018, 11/09/2018    TDAP status: Due, Education has been provided regarding the importance of this vaccine. Advised may receive this vaccine at local pharmacy or Health Dept. Aware to provide a copy of the vaccination record if obtained from local pharmacy or Health Dept. Verbalized acceptance and understanding. Flu Vaccine status: Up to date Pneumococcal vaccine status: Up to date Covid-19 vaccine status: Completed vaccines  Qualifies for Shingles Vaccine? Yes   Zostavax completed No   Shingrix Completed?: Yes  Screening Tests Health Maintenance  Topic Date Due  . TETANUS/TDAP  10/15/2020 (Originally 03/09/2017)  . INFLUENZA VACCINE  Completed  . COVID-19 Vaccine  Completed  . PNA vac Low Risk Adult  Completed    Health Maintenance  There are no preventive care reminders to display for this patient.  Colorectal cancer screening: No longer required.   Lung Cancer Screening: (Low Dose CT Chest recommended if Age 22-80 years, 30 pack-year currently smoking OR have quit w/in 15years.) does not qualify.   Additional Screening:  Vision  Screening: Recommended annual ophthalmology exams for early detection of glaucoma and other disorders of the eye. Is the patient up to date with their annual eye exam? Yes Who is the provider or what is the name of the office in which the patient attends annual eye exams? [facility] If pt is  not established with a provider, would they like to be referred to a provider to establish care? No .   Dental Screening: Recommended annual dental exams for proper oral hygiene  Community Resource Referral / Chronic Care Management: CRR required this visit?  No   CCM required this visit?  No      Plan:     I have personally reviewed and noted the following in the patient's chart:   . Medical and social history . Use of alcohol, tobacco or illicit drugs  . Current medications and supplements . Functional ability and status . Nutritional status . Physical activity . Advanced directives . List of other physicians . Hospitalizations, surgeries, and ER visits in previous 12 months . Vitals . Screenings to include cognitive, depression, and falls . Referrals and appointments  In addition, I have reviewed and discussed with patient certain preventive protocols, quality metrics, and best practice recommendations. A written personalized care plan for preventive services as well as general preventive health recommendations were provided to patient.     Arra Connaughton [place], [place]   [id number]   Nurse Notes: None.

## 2019-10-12 NOTE — Progress Notes (Signed)
Complete physical exam   Patient: William Khan   DOB: 1940-09-01   79 y.o. Male  MRN: [id number] Visit Date: 10/16/2019  Today's healthcare provider: Wilhemena Durie, MD   Chief Complaint  Patient presents with  . Annual Exam   Subjective    William Khan is a 79 y.o. male who presents today for a complete physical exam.  He reports consuming a general diet.  He generally feels well. He reports sleeping well. He does not have additional problems to discuss today.  HPI  Patient had AWV with [facility] today at 8:20 am.  Past Medical History:  Diagnosis Date  . Hyperlipidemia   . Hypertension    Past Surgical History:  Procedure Laterality Date  . SHOULDER SURGERY     Social History   Socioeconomic History  . Marital status: Married    Spouse name: Not on file  . Number of children: 2  . Years of education: Not on file  . Highest education level: Some college, no degree  Occupational History  . Occupation: retired  Tobacco Use  . Smoking status: Former Smoker    Years: 20.00    Quit date: 01/05/1990    Years since quitting: 29.7  . Smokeless tobacco: Never Used  Vaping Use  . Vaping Use: Never used  Substance and Sexual Activity  . Alcohol use: No  . Drug use: No  . Sexual activity: Not on file  Other Topics Concern  . Not on file  Social History Narrative  . Not on file   Social Determinants of Health   Financial Resource Strain: Low Risk   . Difficulty of Paying Living Expenses: Not hard at all  Food Insecurity: No Food Insecurity  . Worried About Charity fundraiser in the Last Year: Never true  . Ran Out of Food in the Last Year: Never true  Transportation Needs: No Transportation Needs  . Lack of Transportation (Medical): No  . Lack of Transportation (Non-Medical): No  Physical Activity: Insufficiently Active  . Days of Exercise per Week: 2 days  . Minutes of Exercise per Session: 10 min  Stress: No Stress Concern Present  . Feeling of Stress :  Not at all  Social Connections: Moderately Isolated  . Frequency of Communication with Friends and Family: More than three times a week  . Frequency of Social Gatherings with Friends and Family: Twice a week  . Attends Religious Services: Never  . Active Member of Clubs or Organizations: No  . Attends Archivist Meetings: Never  . Marital Status: Married  Human resources officer Violence: Not At Risk  . Fear of Current or Ex-Partner: No  . Emotionally Abused: No  . Physically Abused: No  . Sexually Abused: No   Family Status  Relation Name Status  . Mother  Deceased  . Father  Deceased at age 70       due to heart problems  . Sister  Alive  . Brother  Deceased at age 28 months       due to Pertussis and Measles  . Sister  Alive   Family History  Problem Relation Age of Onset  . Arthritis Mother   . Hypertension Mother   . Alzheimer's disease Mother    Allergies  Allergen Reactions  . Ace Inhibitors Swelling and Other (See Comments)    Patient Care Team: Jerrol Banana., MD as PCP - General (Family Medicine) Jannifer Franklin, NP as  Nurse Practitioner (Neurology) Corey Skains, MD as Consulting Physician (Cardiology) Pa, [facility])   Medications: Outpatient Medications Prior to Visit  Medication Sig  . betamethasone dipropionate (DIPROLENE) 0.05 % ointment Apply to affected area BID until clear  . busPIRone (BUSPAR) 30 MG tablet Take 30 mg by mouth 2 (two) times daily.  . Cholecalciferol (VITAMIN D3) 1000 UNITS CAPS Take 1,000 Units by mouth daily.   . DULoxetine (CYMBALTA) 30 MG capsule Take 30 mg by mouth daily.   . fluticasone (FLONASE) 50 MCG/ACT nasal spray USE TWO SPRAY(S) IN EACH NOSTRIL ONCE DAILY (Patient taking differently: Place 2 sprays into both nostrils as needed. )  . levothyroxine (SYNTHROID) 50 MCG tablet TAKE 1 TABLET EVERY DAY  . loratadine (CLARITIN) 10 MG tablet TAKE 1 TABLET EVERY DAY AS NEEDED FOR ITCHING  .  LORazepam (ATIVAN) 0.5 MG tablet Take 0.5 mg by mouth as needed.   . mirtazapine (REMERON) 30 MG tablet Take 30 mg by mouth at bedtime.   . pravastatin (PRAVACHOL) 40 MG tablet Take 40 mg by mouth daily.   . primidone (MYSOLINE) 50 MG tablet Take 50 mg by mouth at bedtime.   . risperiDONE (RISPERDAL) 1 MG tablet Take 1 mg by mouth daily.   . temazepam (RESTORIL) 15 MG capsule Take 15 mg by mouth at bedtime as needed.   . ALPRAZolam (XANAX) 0.5 MG tablet Take 0.5 mg by mouth at bedtime as needed.  (Patient not taking: Reported on 10/16/2019)  . fluticasone (FLONASE) 50 MCG/ACT nasal spray Place 2 sprays into both nostrils daily. (Patient not taking: Reported on 10/16/2019)  . PARoxetine (PAXIL) 30 MG tablet Take 30 mg by mouth daily.  (Patient not taking: Reported on 10/16/2019)  . propranolol (INDERAL) 20 MG tablet Take 20 mg by mouth 2 (two) times daily. (Patient not taking: Reported on 10/16/2019)  . QUEtiapine (SEROQUEL) 50 MG tablet  (Patient not taking: Reported on 10/16/2019)  . traZODone (DESYREL) 50 MG tablet Take 1 tablet (50 mg total) by mouth at bedtime as needed for sleep. (Patient not taking: Reported on 09/20/2017)   No facility-administered medications prior to visit.    Review of Systems  Constitutional: Negative.   HENT: Negative.   Eyes: Negative.   Respiratory: Negative.   Cardiovascular: Negative.   Gastrointestinal: Negative.   Endocrine: Negative.   Genitourinary: Negative.   Musculoskeletal: Negative.   Skin: Negative.   Allergic/Immunologic: Negative.   Neurological: Negative.   Hematological: Negative.   Psychiatric/Behavioral: Negative.        Objective    BP 140/76 (BP Location: Right Arm, Patient Position: Sitting, Cuff Size: Normal)   Pulse 75   Temp 97.7 F (36.5 C) (Oral)   Ht 5\' 9"  (1.753 m)   Wt 180 lb 6.4 oz (81.8 kg)   SpO2 98%   BMI 26.64 kg/m     Physical Exam Vitals reviewed.  Constitutional:      Appearance: He is well-developed.   HENT:     Head: Normocephalic and atraumatic.     Right Ear: External ear normal.     Left Ear: External ear normal.     Nose: Nose normal.  Eyes:     General: No scleral icterus.    Comments: Right pupil chronically maximally dilated.  Neck:     Thyroid: No thyromegaly.  Cardiovascular:     Rate and Rhythm: Normal rate and regular rhythm.     Heart sounds: Normal heart sounds.  Pulmonary:  Effort: Pulmonary effort is normal.     Breath sounds: Normal breath sounds.  Abdominal:     Palpations: Abdomen is soft.  Genitourinary:    Penis: Normal.      Testes: Normal.     Prostate: Normal.     Rectum: Normal.  Skin:    General: Skin is warm and dry.     Comments: Fair skin.  Chronic vitiligo  Neurological:     General: No focal deficit present.     Mental Status: He is alert and oriented to person, place, and time.  Psychiatric:        Mood and Affect: Mood normal.        Behavior: Behavior normal.        Thought Content: Thought content normal.        Judgment: Judgment normal.       Last depression screening scores PHQ 2/9 Scores 10/16/2019 09/22/2018 09/20/2017  PHQ - 2 Score 0 0 0  PHQ- 9 Score - - 1   Last fall risk screening Fall Risk  10/16/2019  Falls in the past year? 1  Number falls in past yr: 1  Injury with Fall? 0  Risk for fall due to : Impaired balance/gait  Follow up Falls prevention discussed   Last Audit-C alcohol use screening Alcohol Use Disorder Test (AUDIT) 10/16/2019  1. How often do you have a drink containing alcohol? 0  2. How many drinks containing alcohol do you have on a typical day when you are drinking? 0  3. How often do you have six or more drinks on one occasion? 0  AUDIT-C Score 0  Alcohol Brief Interventions/Follow-up AUDIT Score <7 follow-up not indicated   A score of 3 or more in women, and 4 or more in men indicates increased risk for alcohol abuse, EXCEPT if all of the points are from question 1   No results found for  any visits on 10/16/19.  Assessment & Plan    Routine Health Maintenance and Physical Exam  Exercise Activities and Dietary recommendations Goals    . LIFESTYLE - DECREASE FALLS RISK     Recommend to remove any items from the home that may cause slips or trips.       Immunization History  Administered Date(s) Administered  . Fluad Quad(high Dose 65+) 10/13/2018  . Influenza, High Dose Seasonal PF 11/14/2015, 10/22/2016, 10/11/2019  . Influenza-Unspecified 10/05/2012, 09/15/2017  . PFIZER SARS-COV-2 Vaccination 01/11/2019, 02/04/2019  . Pneumococcal Conjugate-13 12/04/2013  . Pneumococcal Polysaccharide-23 10/14/2006  . Td 03/10/2007  . Zoster Recombinat (Shingrix) 08/04/2018, 11/09/2018    Health Maintenance  Topic Date Due  . TETANUS/TDAP  10/15/2020 (Originally 03/09/2017)  . INFLUENZA VACCINE  Completed  . COVID-19 Vaccine  Completed  . PNA vac Low Risk Adult  Completed    Discussed health benefits of physical activity, and encouraged him to engage in regular exercise appropriate for his age and condition.  1. Annual physical exam   2. Encounter for Medicare annual wellness exam  - POCT Urinalysis Dipstick  3. Combined fat and carbohydrate induced hyperlipemia  - Lipid Profile  4. Anxiety   5. Essential (primary) hypertension  - CBC with Differential - Comprehensive Metabolic Panel (CMET)  6. Major depressive disorder with current active episode, unspecified depression episode severity, unspecified whether recurrent   7. Hypothyroidism, unspecified type  - TSH  8. Psoriasis/vitiligo  - Ambulatory referral to Dermatology   No follow-ups on file.     Theophilus Kinds  Cranford Mon, MD, have reviewed all documentation for this visit. The documentation on 10/22/19 for the exam, diagnosis, procedures, and orders are all accurate and complete.    Andersson Larrabee Cranford Mon, MD  [facility] [phone] (phone) [phone] (fax)  [place]

## 2019-10-16 ENCOUNTER — Other Ambulatory Visit: Payer: Self-pay

## 2019-10-16 ENCOUNTER — Ambulatory Visit (INDEPENDENT_AMBULATORY_CARE_PROVIDER_SITE_OTHER): Payer: Medicare HMO

## 2019-10-16 ENCOUNTER — Ambulatory Visit (INDEPENDENT_AMBULATORY_CARE_PROVIDER_SITE_OTHER): Payer: Medicare HMO | Admitting: Family Medicine

## 2019-10-16 ENCOUNTER — Encounter: Payer: Self-pay | Admitting: Family Medicine

## 2019-10-16 VITALS — BP 140/76 | HR 75 | Temp 97.7°F | Ht 69.0 in | Wt 180.4 lb

## 2019-10-16 DIAGNOSIS — E039 Hypothyroidism, unspecified: Secondary | ICD-10-CM | POA: Diagnosis not present

## 2019-10-16 DIAGNOSIS — F419 Anxiety disorder, unspecified: Secondary | ICD-10-CM | POA: Diagnosis not present

## 2019-10-16 DIAGNOSIS — F329 Major depressive disorder, single episode, unspecified: Secondary | ICD-10-CM

## 2019-10-16 DIAGNOSIS — I10 Essential (primary) hypertension: Secondary | ICD-10-CM | POA: Diagnosis not present

## 2019-10-16 DIAGNOSIS — Z0000 Encounter for general adult medical examination without abnormal findings: Secondary | ICD-10-CM

## 2019-10-16 DIAGNOSIS — L400 Psoriasis vulgaris: Secondary | ICD-10-CM

## 2019-10-16 DIAGNOSIS — E782 Mixed hyperlipidemia: Secondary | ICD-10-CM | POA: Diagnosis not present

## 2019-10-16 LAB — POCT URINALYSIS DIPSTICK
Bilirubin, UA: NEGATIVE
Blood, UA: NEGATIVE
Glucose, UA: NEGATIVE
Ketones, UA: NEGATIVE
Leukocytes, UA: NEGATIVE
Nitrite, UA: NEGATIVE
Protein, UA: NEGATIVE
Spec Grav, UA: 1.02 (ref 1.010–1.025)
Urobilinogen, UA: 0.2 E.U./dL
pH, UA: 7.5 (ref 5.0–8.0)

## 2019-10-16 NOTE — Patient Instructions (Signed)
William Khan , Thank you for taking time to come for your Medicare Wellness Visit. I appreciate your ongoing commitment to your health goals. Please review the following plan we discussed and let me know if I can assist you in the future.   Screening recommendations/referrals: Colonoscopy: No longer required.  Recommended yearly ophthalmology/optometry visit for glaucoma screening and checkup Recommended yearly dental visit for hygiene and checkup  Vaccinations: Influenza vaccine: Done 10/11/19 Pneumococcal vaccine: Completed series Tdap vaccine: Currently due, declined at this time.  Shingles vaccine: Completed series    Advanced directives: Advance directive discussed with you today. Even though you declined this today please call our office should you change your mind and we can give you the proper paperwork for you to fill out.  Conditions/risks identified: Fall risk preventatives discussed today.   Next appointment: 9:00 AM today with Dr Rosanna Randy   Preventive Care 3 Years and Older, Male Preventive care refers to lifestyle choices and visits with your health care provider that can promote health and wellness. What does preventive care include?  A yearly physical exam. This is also called an annual well check.  Dental exams once or twice a year.  Routine eye exams. Ask your health care provider how often you should have your eyes checked.  Personal lifestyle choices, including:  Daily care of your teeth and gums.  Regular physical activity.  Eating a healthy diet.  Avoiding tobacco and drug use.  Limiting alcohol use.  Practicing safe sex.  Taking low doses of aspirin every day.  Taking vitamin and mineral supplements as recommended by your health care provider. What happens during an annual well check? The services and screenings done by your health care provider during your annual well check will depend on your age, overall health, lifestyle risk factors, and family  history of disease. Counseling  Your health care provider may ask you questions about your:  Alcohol use.  Tobacco use.  Drug use.  Emotional well-being.  Home and relationship well-being.  Sexual activity.  Eating habits.  History of falls.  Memory and ability to understand (cognition).  Work and work Statistician. Screening  You may have the following tests or measurements:  Height, weight, and BMI.  Blood pressure.  Lipid and cholesterol levels. These may be checked every 5 years, or more frequently if you are over 71 years old.  Skin check.  Lung cancer screening. You may have this screening every year starting at age 62 if you have a 30-pack-year history of smoking and currently smoke or have quit within the past 15 years.  Fecal occult blood test (FOBT) of the stool. You may have this test every year starting at age 50.  Flexible sigmoidoscopy or colonoscopy. You may have a sigmoidoscopy every 5 years or a colonoscopy every 10 years starting at age 48.  Prostate cancer screening. Recommendations will vary depending on your family history and other risks.  Hepatitis C blood test.  Hepatitis B blood test.  Sexually transmitted disease (STD) testing.  Diabetes screening. This is done by checking your blood sugar (glucose) after you have not eaten for a while (fasting). You may have this done every 1-3 years.  Abdominal aortic aneurysm (AAA) screening. You may need this if you are a current or former smoker.  Osteoporosis. You may be screened starting at age 39 if you are at high risk. Talk with your health care provider about your test results, treatment options, and if necessary, the need for more tests.  Vaccines  Your health care provider may recommend certain vaccines, such as:  Influenza vaccine. This is recommended every year.  Tetanus, diphtheria, and acellular pertussis (Tdap, Td) vaccine. You may need a Td booster every 10 years.  Zoster vaccine.  You may need this after age 22.  Pneumococcal 13-valent conjugate (PCV13) vaccine. One dose is recommended after age 3.  Pneumococcal polysaccharide (PPSV23) vaccine. One dose is recommended after age 80. Talk to your health care provider about which screenings and vaccines you need and how often you need them. This information is not intended to replace advice given to you by your health care provider. Make sure you discuss any questions you have with your health care provider. Document Released: 01/18/2015 Document Revised: 09/11/2015 Document Reviewed: 10/23/2014 Elsevier Interactive Patient Education  2017 [place] Prevention in the Home Falls can cause injuries. They can happen to people of all ages. There are many things you can do to make your home safe and to help prevent falls. What can I do on the outside of my home?  Regularly fix the edges of walkways and driveways and fix any cracks.  Remove anything that might make you trip as you walk through a door, such as a raised step or threshold.  Trim any bushes or trees on the path to your home.  Use bright outdoor lighting.  Clear any walking paths of anything that might make someone trip, such as rocks or tools.  Regularly check to see if handrails are loose or broken. Make sure that both sides of any steps have handrails.  Any raised decks and porches should have guardrails on the edges.  Have any leaves, snow, or ice cleared regularly.  Use sand or salt on walking paths during winter.  Clean up any spills in your garage right away. This includes oil or grease spills. What can I do in the bathroom?  Use night lights.  Install grab bars by the toilet and in the tub and shower. Do not use towel bars as grab bars.  Use non-skid mats or decals in the tub or shower.  If you need to sit down in the shower, use a plastic, non-slip stool.  Keep the floor dry. Clean up any water that spills on the floor as soon  as it happens.  Remove soap buildup in the tub or shower regularly.  Attach bath mats securely with double-sided non-slip rug tape.  Do not have throw rugs and other things on the floor that can make you trip. What can I do in the bedroom?  Use night lights.  Make sure that you have a light by your bed that is easy to reach.  Do not use any sheets or blankets that are too big for your bed. They should not hang down onto the floor.  Have a firm chair that has side arms. You can use this for support while you get dressed.  Do not have throw rugs and other things on the floor that can make you trip. What can I do in the kitchen?  Clean up any spills right away.  Avoid walking on wet floors.  Keep items that you use a lot in easy-to-reach places.  If you need to reach something above you, use a strong step stool that has a grab bar.  Keep electrical cords out of the way.  Do not use floor polish or wax that makes floors slippery. If you must use wax, use non-skid floor wax.  Do not have throw rugs and other things on the floor that can make you trip. What can I do with my stairs?  Do not leave any items on the stairs.  Make sure that there are handrails on both sides of the stairs and use them. Fix handrails that are broken or loose. Make sure that handrails are as long as the stairways.  Check any carpeting to make sure that it is firmly attached to the stairs. Fix any carpet that is loose or worn.  Avoid having throw rugs at the top or bottom of the stairs. If you do have throw rugs, attach them to the floor with carpet tape.  Make sure that you have a light switch at the top of the stairs and the bottom of the stairs. If you do not have them, ask someone to add them for you. What else can I do to help prevent falls?  Wear shoes that:  Do not have high heels.  Have rubber bottoms.  Are comfortable and fit you well.  Are closed at the toe. Do not wear sandals.  If  you use a stepladder:  Make sure that it is fully opened. Do not climb a closed stepladder.  Make sure that both sides of the stepladder are locked into place.  Ask someone to hold it for you, if possible.  Clearly mark and make sure that you can see:  Any grab bars or handrails.  First and last steps.  Where the edge of each step is.  Use tools that help you move around (mobility aids) if they are needed. These include:  Canes.  Walkers.  Scooters.  Crutches.  Turn on the lights when you go into a dark area. Replace any light bulbs as soon as they burn out.  Set up your furniture so you have a clear path. Avoid moving your furniture around.  If any of your floors are uneven, fix them.  If there are any pets around you, be aware of where they are.  Review your medicines with your doctor. Some medicines can make you feel dizzy. This can increase your chance of falling. Ask your doctor what other things that you can do to help prevent falls. This information is not intended to replace advice given to you by your health care provider. Make sure you discuss any questions you have with your health care provider. Document Released: 10/18/2008 Document Revised: 05/30/2015 Document Reviewed: 01/26/2014 Elsevier Interactive Patient Education  2017 [employer].

## 2019-10-17 LAB — COMPREHENSIVE METABOLIC PANEL
ALT: 20 IU/L (ref 0–44)
AST: 20 IU/L (ref 0–40)
Albumin/Globulin Ratio: 1.8 (ref 1.2–2.2)
Albumin: 4.6 g/dL (ref 3.6–4.6)
Alkaline Phosphatase: 88 IU/L (ref 44–121)
BUN/Creatinine Ratio: 9 — ABNORMAL LOW (ref 10–24)
BUN: 9 mg/dL (ref 8–27)
Bilirubin Total: 0.4 mg/dL (ref 0.0–1.2)
CO2: 26 mmol/L (ref 20–29)
Calcium: 9.7 mg/dL (ref 8.6–10.2)
Chloride: 96 mmol/L (ref 96–106)
Creatinine, Ser: 0.98 mg/dL (ref 0.76–1.27)
GFR calc Af Amer: 83 mL/min/{1.73_m2} (ref >59)
GFR calc non Af Amer: 72 mL/min/{1.73_m2} (ref >59)
Globulin, Total: 2.6 g/dL (ref 1.5–4.5)
Glucose: 101 mg/dL — ABNORMAL HIGH (ref 65–99)
Potassium: 4.5 mmol/L (ref 3.5–5.2)
Sodium: 136 mmol/L (ref 134–144)
Total Protein: 7.2 g/dL (ref 6.0–8.5)

## 2019-10-17 LAB — CBC WITH DIFFERENTIAL/PLATELET
Basophils Absolute: 0.1 10*3/uL (ref 0.0–0.2)
Basos: 1 %
EOS (ABSOLUTE): 0.4 10*3/uL (ref 0.0–0.4)
Eos: 5 %
Hematocrit: 46.2 % (ref 37.5–51.0)
Hemoglobin: 15.9 g/dL (ref 13.0–17.7)
Immature Grans (Abs): 0 10*3/uL (ref 0.0–0.1)
Immature Granulocytes: 0 %
Lymphocytes Absolute: 2 10*3/uL (ref 0.7–3.1)
Lymphs: 31 %
MCH: 32.6 pg (ref 26.6–33.0)
MCHC: 34.4 g/dL (ref 31.5–35.7)
MCV: 95 fL (ref 79–97)
Monocytes Absolute: 0.5 10*3/uL (ref 0.1–0.9)
Monocytes: 8 %
Neutrophils Absolute: 3.6 10*3/uL (ref 1.4–7.0)
Neutrophils: 55 %
Platelets: 170 10*3/uL (ref 150–450)
RBC: 4.87 x10E6/uL (ref 4.14–5.80)
RDW: 12.9 % (ref 11.6–15.4)
WBC: 6.6 10*3/uL (ref 3.4–10.8)

## 2019-10-17 LAB — LIPID PANEL
Chol/HDL Ratio: 5.4 ratio — ABNORMAL HIGH (ref 0.0–5.0)
Cholesterol, Total: 194 mg/dL (ref 100–199)
HDL: 36 mg/dL — ABNORMAL LOW (ref >39)
LDL Chol Calc (NIH): 122 mg/dL — ABNORMAL HIGH (ref 0–99)
Triglycerides: 204 mg/dL — ABNORMAL HIGH (ref 0–149)
VLDL Cholesterol Cal: 36 mg/dL (ref 5–40)

## 2019-10-17 LAB — TSH: TSH: 4.43 u[IU]/mL (ref 0.450–4.500)

## 2019-10-30 ENCOUNTER — Other Ambulatory Visit: Payer: Self-pay | Admitting: Family Medicine

## 2019-10-30 DIAGNOSIS — J309 Allergic rhinitis, unspecified: Secondary | ICD-10-CM

## 2019-11-13 ENCOUNTER — Other Ambulatory Visit: Payer: Self-pay | Admitting: Family Medicine

## 2019-11-13 DIAGNOSIS — E039 Hypothyroidism, unspecified: Secondary | ICD-10-CM

## 2019-11-13 NOTE — Telephone Encounter (Signed)
Requested Prescriptions  Pending Prescriptions Disp Refills   levothyroxine (SYNTHROID) 50 MCG tablet [Pharmacy Med Name: LEVOTHYROXINE SODIUM 50 MCG Tablet] 90 tablet 0    Sig: TAKE 1 TABLET EVERY DAY     Endocrinology:  Hypothyroid Agents Failed - 11/13/2019  6:09 AM      Failed - TSH needs to be rechecked within 3 months after an abnormal result. Refill until TSH is due.      Failed - Valid encounter within last 12 months    Recent Outpatient Visits          4 weeks ago Annual physical exam   [facility] Jerrol Banana., MD   1 year ago Essential (primary) hypertension   [facility] Jerrol Banana., MD   2 years ago Essential (primary) hypertension   [facility] Jerrol Banana., MD   3 years ago Essential (primary) hypertension   [facility] Jerrol Banana., MD   4 years ago Essential (primary) hypertension   [facility] Jerrol Banana., MD      Future Appointments            In 3 months Ralene Bathe, MD [place] - TSH in normal range and within 360 days    TSH  Date Value Ref Range Status  10/16/2019 4.430 0.450 - 4.500 uIU/mL Final

## 2020-01-26 ENCOUNTER — Other Ambulatory Visit: Payer: Self-pay | Admitting: Family Medicine

## 2020-01-26 DIAGNOSIS — E039 Hypothyroidism, unspecified: Secondary | ICD-10-CM

## 2020-01-26 NOTE — Telephone Encounter (Signed)
Requested medications are due for refill today yes  Requested medications are on the active medication list yes  Last refill 11/9  Last visit 10/2019  Future visit scheduled 10/2020  Notes to clinic Did not have TSH drawn as ordered at last office visit in October, therefore Failed protocol due to no valid lab within 360 days.Marland Kitchen

## 2020-03-06 ENCOUNTER — Ambulatory Visit: Payer: Medicare Other | Admitting: Dermatology

## 2020-03-20 DIAGNOSIS — E782 Mixed hyperlipidemia: Secondary | ICD-10-CM | POA: Diagnosis not present

## 2020-03-20 DIAGNOSIS — I493 Ventricular premature depolarization: Secondary | ICD-10-CM | POA: Diagnosis not present

## 2020-03-20 DIAGNOSIS — I10 Essential (primary) hypertension: Secondary | ICD-10-CM | POA: Diagnosis not present

## 2020-03-20 DIAGNOSIS — R0602 Shortness of breath: Secondary | ICD-10-CM | POA: Diagnosis not present

## 2020-03-20 DIAGNOSIS — R002 Palpitations: Secondary | ICD-10-CM | POA: Diagnosis not present

## 2020-03-20 DIAGNOSIS — R001 Bradycardia, unspecified: Secondary | ICD-10-CM | POA: Diagnosis not present

## 2020-05-16 DIAGNOSIS — F411 Generalized anxiety disorder: Secondary | ICD-10-CM | POA: Diagnosis not present

## 2020-05-16 DIAGNOSIS — G479 Sleep disorder, unspecified: Secondary | ICD-10-CM | POA: Diagnosis not present

## 2020-05-16 DIAGNOSIS — G250 Essential tremor: Secondary | ICD-10-CM | POA: Diagnosis not present

## 2020-05-27 ENCOUNTER — Telehealth: Payer: Self-pay | Admitting: Family Medicine

## 2020-05-27 DIAGNOSIS — L400 Psoriasis vulgaris: Secondary | ICD-10-CM

## 2020-05-27 NOTE — Telephone Encounter (Signed)
Pt is calling because he was calling to ask what dermatologist he was referred to by Dr. Rosanna Randy. Pt was advised that the referral was closed in 10/21 Pt stated at the time he did not need a dermatologist. Pt states that he is in need of a a dermatologist at this time. However, he will look in the phone book and call a dermatologist. CB- [phone]

## 2020-05-28 DIAGNOSIS — L538 Other specified erythematous conditions: Secondary | ICD-10-CM | POA: Diagnosis not present

## 2020-05-28 DIAGNOSIS — L821 Other seborrheic keratosis: Secondary | ICD-10-CM | POA: Diagnosis not present

## 2020-05-28 DIAGNOSIS — L80 Vitiligo: Secondary | ICD-10-CM | POA: Diagnosis not present

## 2020-05-28 DIAGNOSIS — L400 Psoriasis vulgaris: Secondary | ICD-10-CM | POA: Diagnosis not present

## 2020-05-28 DIAGNOSIS — D2262 Melanocytic nevi of left upper limb, including shoulder: Secondary | ICD-10-CM | POA: Diagnosis not present

## 2020-05-28 DIAGNOSIS — X32XXXA Exposure to sunlight, initial encounter: Secondary | ICD-10-CM | POA: Diagnosis not present

## 2020-05-28 DIAGNOSIS — L570 Actinic keratosis: Secondary | ICD-10-CM | POA: Diagnosis not present

## 2020-05-28 DIAGNOSIS — D485 Neoplasm of uncertain behavior of skin: Secondary | ICD-10-CM | POA: Diagnosis not present

## 2020-05-28 DIAGNOSIS — L820 Inflamed seborrheic keratosis: Secondary | ICD-10-CM | POA: Diagnosis not present

## 2020-05-28 NOTE — Telephone Encounter (Signed)
ok 

## 2020-05-28 NOTE — Telephone Encounter (Signed)
Patient is requesting to be referred back to dermatology. Ok to place referral?

## 2020-05-29 NOTE — Telephone Encounter (Signed)
Order has been placed. KW

## 2020-06-04 ENCOUNTER — Other Ambulatory Visit: Payer: Self-pay | Admitting: Family Medicine

## 2020-06-04 DIAGNOSIS — J309 Allergic rhinitis, unspecified: Secondary | ICD-10-CM

## 2020-06-04 NOTE — Telephone Encounter (Signed)
   Notes to clinic: Patient has appt on 10/21/2020 Review for enough medication until that time    Requested Prescriptions  Pending Prescriptions Disp Refills   loratadine (CLARITIN) 10 MG tablet [Pharmacy Med Name: LORATADINE 10 MG Tablet] 90 tablet 2    Sig: TAKE 1 TABLET EVERY DAY AS NEEDED FOR ITCHING      Ear, Nose, and Throat:  Antihistamines Failed - 06/04/2020  9:19 AM      Failed - Valid encounter within last 12 months    Recent Outpatient Visits           7 months ago Annual physical exam   [facility] Jerrol Banana., MD   1 year ago Essential (primary) hypertension   [facility] Jerrol Banana., MD   2 years ago Essential (primary) hypertension   [facility] Jerrol Banana., MD   3 years ago Essential (primary) hypertension   [facility] Jerrol Banana., MD   5 years ago Essential (primary) hypertension   [facility] Jerrol Banana., MD

## 2020-09-17 ENCOUNTER — Telehealth: Payer: Self-pay

## 2020-09-17 DIAGNOSIS — E039 Hypothyroidism, unspecified: Secondary | ICD-10-CM

## 2020-09-17 NOTE — Telephone Encounter (Signed)
Patient needing refills on Levothyroxine  50 mcg to be sent to [facility] on [address].

## 2020-09-18 MED ORDER — LEVOTHYROXINE SODIUM 50 MCG PO TABS: 50.0000 ug | ORAL_TABLET | Freq: Every day | ORAL | 1 refills | Status: DC

## 2020-09-18 NOTE — Telephone Encounter (Signed)
Rx sent to pharmacy   

## 2020-09-23 DIAGNOSIS — H43813 Vitreous degeneration, bilateral: Secondary | ICD-10-CM | POA: Diagnosis not present

## 2020-10-21 ENCOUNTER — Other Ambulatory Visit: Payer: Self-pay

## 2020-10-21 ENCOUNTER — Encounter: Payer: Self-pay | Admitting: Family Medicine

## 2020-10-21 ENCOUNTER — Ambulatory Visit (INDEPENDENT_AMBULATORY_CARE_PROVIDER_SITE_OTHER): Payer: Medicare Other | Admitting: Family Medicine

## 2020-10-21 VITALS — BP 150/87 | HR 60 | Temp 97.8°F | Ht 70.0 in | Wt 185.0 lb

## 2020-10-21 DIAGNOSIS — Z23 Encounter for immunization: Secondary | ICD-10-CM

## 2020-10-21 DIAGNOSIS — E782 Mixed hyperlipidemia: Secondary | ICD-10-CM

## 2020-10-21 DIAGNOSIS — E039 Hypothyroidism, unspecified: Secondary | ICD-10-CM

## 2020-10-21 DIAGNOSIS — R7303 Prediabetes: Secondary | ICD-10-CM | POA: Diagnosis not present

## 2020-10-21 DIAGNOSIS — Z0000 Encounter for general adult medical examination without abnormal findings: Secondary | ICD-10-CM | POA: Diagnosis not present

## 2020-10-21 DIAGNOSIS — T148XXA Other injury of unspecified body region, initial encounter: Secondary | ICD-10-CM

## 2020-10-21 DIAGNOSIS — I10 Essential (primary) hypertension: Secondary | ICD-10-CM | POA: Diagnosis not present

## 2020-10-21 DIAGNOSIS — F329 Major depressive disorder, single episode, unspecified: Secondary | ICD-10-CM

## 2020-10-21 DIAGNOSIS — F419 Anxiety disorder, unspecified: Secondary | ICD-10-CM

## 2020-10-21 NOTE — Progress Notes (Signed)
I,William Khan,acting as a scribe for William Durie, MD.,have documented all relevant documentation on the behalf of William Durie, MD,as directed by  William Durie, MD while in the presence of William Durie, MD.   Complete physical exam   Patient: William Khan   DOB: [birth year]   80 y.o. Male  MRN: [id number] Visit Date: 10/21/2020  Today's healthcare provider: Wilhemena Durie, MD   Chief Complaint  Patient presents with   Medicare Wellness   Subjective    William Khan is a 80 y.o. male who presents today for a complete physical exam.  He reports consuming a general diet.  He generally feels . He reports sleeping well. He does have additional problems to discuss today.  HPI    Past Medical History:  Diagnosis Date   Hyperlipidemia    Hypertension    Past Surgical History:  Procedure Laterality Date   SHOULDER SURGERY     Social History   Socioeconomic History   Marital status: Married    Spouse name: Not on file   Number of children: 2   Years of education: Not on file   Highest education level: Some college, no degree  Occupational History   Occupation: retired  Tobacco Use   Smoking status: Former    Years: 20.00    Types: Cigarettes    Quit date: 01/05/1990    Years since quitting: 30.8   Smokeless tobacco: Never  Vaping Use   Vaping Use: Never used  Substance and Sexual Activity   Alcohol use: No   Drug use: No   Sexual activity: Not on file  Other Topics Concern   Not on file  Social History Narrative   Not on file   Social Determinants of Health   Financial Resource Strain: Not on file  Food Insecurity: Not on file  Transportation Needs: Not on file  Physical Activity: Not on file  Stress: Not on file  Social Connections: Not on file  Intimate Partner Violence: Not on file   Family Status  Relation Name Status   Mother  Deceased   Father  Deceased at age 94       due to heart problems   Sister  Alive   Brother   Deceased at age 60 months       due to Pertussis and Measles   Sister  Alive   Family History  Problem Relation Age of Onset   Arthritis Mother    Hypertension Mother    Alzheimer's disease Mother    Allergies  Allergen Reactions   Ace Inhibitors Swelling and Other (See Comments)    Patient Care Team: Jerrol Banana., MD as PCP - General (Family Medicine) Jannifer Franklin, NP as Nurse Practitioner (Neurology) Corey Skains, MD as Consulting Physician (Cardiology) [facility], [place] (Optometry)   Medications: Outpatient Medications Prior to Visit  Medication Sig   betamethasone dipropionate (DIPROLENE) 0.05 % ointment Apply to affected area BID until clear   Cholecalciferol (VITAMIN D3) 1000 UNITS CAPS Take 1,000 Units by mouth daily.    DULoxetine (CYMBALTA) 30 MG capsule Take 30 mg by mouth daily.    fluticasone (FLONASE) 50 MCG/ACT nasal spray USE TWO SPRAY(S) IN EACH NOSTRIL ONCE DAILY (Patient taking differently: Place 2 sprays into both nostrils as needed.)   levothyroxine (SYNTHROID) 50 MCG tablet Take 1 tablet (50 mcg total) by mouth daily.   loratadine (CLARITIN) 10 MG tablet TAKE 1  TABLET EVERY DAY AS NEEDED FOR ITCHING   PARoxetine (PAXIL) 30 MG tablet Take 30 mg by mouth daily.   pravastatin (PRAVACHOL) 40 MG tablet Take 40 mg by mouth daily.    propranolol (INDERAL) 20 MG tablet Take 20 mg by mouth 2 (two) times daily.   QUEtiapine (SEROQUEL) 50 MG tablet    risperiDONE (RISPERDAL) 1 MG tablet Take 1 mg by mouth daily.    ALPRAZolam (XANAX) 0.5 MG tablet Take 0.5 mg by mouth at bedtime as needed.  (Patient not taking: No sig reported)   busPIRone (BUSPAR) 30 MG tablet Take 30 mg by mouth 2 (two) times daily.   LORazepam (ATIVAN) 0.5 MG tablet Take 0.5 mg by mouth as needed.    mirtazapine (REMERON) 30 MG tablet Take 30 mg by mouth at bedtime.    primidone (MYSOLINE) 50 MG tablet Take 50 mg by mouth at bedtime.    temazepam (RESTORIL) 15 MG capsule Take  15 mg by mouth at bedtime as needed.    traZODone (DESYREL) 50 MG tablet Take 1 tablet (50 mg total) by mouth at bedtime as needed for sleep. (Patient not taking: No sig reported)   [DISCONTINUED] fluticasone (FLONASE) 50 MCG/ACT nasal spray Place 2 sprays into both nostrils daily.   No facility-administered medications prior to visit.    Review of Systems  Constitutional: Negative.   HENT: Negative.    Eyes:  Positive for photophobia.  Respiratory: Negative.    Cardiovascular: Negative.   Gastrointestinal: Negative.   Endocrine: Negative.   Genitourinary:  Positive for frequency.  Musculoskeletal: Negative.   Skin: Negative.   Allergic/Immunologic: Negative.   Neurological: Negative.   Hematological:  Bruises/bleeds easily.  Psychiatric/Behavioral: Negative.        Objective    BP (!) 150/87 (BP Location: Right Arm, Patient Position: Sitting, Cuff Size: Normal)   Pulse 60   Temp 97.8 F (36.6 C) (Oral)   Ht 5\' 10"  (1.778 m)   Wt 185 lb (83.9 kg)   SpO2 94%   BMI 26.54 kg/m  BP Readings from Last 3 Encounters:  10/21/20 (!) 150/87  10/16/19 140/76  10/16/19 140/76   Wt Readings from Last 3 Encounters:  10/21/20 185 lb (83.9 kg)  10/16/19 180 lb 6.4 oz (81.8 kg)  10/16/19 180 lb 6.4 oz (81.8 kg)   SpO2 Readings from Last 3 Encounters:  10/21/20 94%  10/16/19 98%  10/16/19 98%      Physical Exam Constitutional:      Appearance: Normal appearance. He is normal weight.  HENT:     Head: Normocephalic and atraumatic.     Right Ear: Tympanic membrane, ear canal and external ear normal.     Left Ear: Tympanic membrane, ear canal and external ear normal.     Nose: Nose normal.     Mouth/Throat:     Mouth: Mucous membranes are moist.     Pharynx: Oropharynx is clear.  Eyes:     Extraocular Movements: Extraocular movements intact.     Conjunctiva/sclera: Conjunctivae normal.     Pupils: Pupils are equal, round, and reactive to light.  Cardiovascular:     Rate  and Rhythm: Normal rate and regular rhythm.     Pulses: Normal pulses.     Heart sounds: Normal heart sounds.  Pulmonary:     Effort: Pulmonary effort is normal.     Breath sounds: Normal breath sounds.  Abdominal:     General: Abdomen is flat. Bowel sounds are normal.  Palpations: Abdomen is soft.  Musculoskeletal:     Cervical back: Normal range of motion and neck supple.  Skin:    General: Skin is warm and dry.     Comments: Very fair skin  Neurological:     General: No focal deficit present.     Mental Status: He is alert and oriented to person, place, and time. Mental status is at baseline.  Psychiatric:        Mood and Affect: Mood normal.        Behavior: Behavior normal.        Thought Content: Thought content normal.        Judgment: Judgment normal.      Last depression screening scores PHQ 2/9 Scores 10/21/2020 10/16/2019 09/22/2018  PHQ - 2 Score 0 0 0  PHQ- 9 Score 1 - -   Last fall risk screening Fall Risk  10/21/2020  Falls in the past year? 0  Number falls in past yr: 0  Injury with Fall? 0  Risk for fall due to : -  Follow up -   Last Audit-C alcohol use screening Alcohol Use Disorder Test (AUDIT) 10/21/2020  1. How often do you have a drink containing alcohol? 1  2. How many drinks containing alcohol do you have on a typical day when you are drinking? 0  3. How often do you have six or more drinks on one occasion? 0  AUDIT-C Score 1  Alcohol Brief Interventions/Follow-up -   A score of 3 or more in women, and 4 or more in men indicates increased risk for alcohol abuse, EXCEPT if all of the points are from question 1   No results found for any visits on 10/21/20.  Assessment & Plan    Routine Health Maintenance and Physical Exam  Exercise Activities and Dietary recommendations  Goals      LIFESTYLE - DECREASE FALLS RISK     Recommend to remove any items from the home that may cause slips or trips.        Immunization History   Administered Date(s) Administered   Fluad Quad(high Dose 65+) 10/13/2018   Influenza, High Dose Seasonal PF 11/14/2015, 10/22/2016, 10/11/2019   Influenza-Unspecified 10/05/2012, 09/15/2017   PFIZER(Purple Top)SARS-COV-2 Vaccination 01/11/2019, 02/04/2019   Pneumococcal Conjugate-13 12/04/2013   Pneumococcal Polysaccharide-23 10/14/2006   Td 03/10/2007   Zoster Recombinat (Shingrix) 08/04/2018, 11/09/2018    Health Maintenance  Topic Date Due   TETANUS/TDAP  03/09/2017   COVID-19 Vaccine (3 - Pfizer risk series) 03/04/2019   INFLUENZA VACCINE  08/05/2020   Zoster Vaccines- Shingrix  Completed   HPV VACCINES  Aged Out    Discussed health benefits of physical activity, and encouraged him to engage in regular exercise appropriate for his age and condition.  .1. Annual physical exam   2. Acquired hypothyroidism  - TSH  3. Essential (primary) hypertension  - CBC with Differential/Platelet - Comprehensive metabolic panel  4. hyperlipemia  - Lipid Panel With LDL/HDL Ratio  5. Pre-diabetes  - Hemoglobin A1c  6. Puncture wound Of left finger.  No infection - Td vaccine greater than or equal to 7yo preservative free IM  7. Anxiety Followed by psychiatry and good control  8. Major depressive disorder with current active episode, unspecified depression episode severity, unspecified whether recurrent Followed by psychiatry  9. Hypothyroidism, unspecified type    No follow-ups on file.     I, William Durie, MD, have reviewed all documentation for this visit. The  documentation on 10/27/20 for the exam, diagnosis, procedures, and orders are all accurate and complete.    Medrith Veillon Cranford Mon, MD  [facility] [phone] (phone) [phone] (fax)  [place]

## 2020-10-22 LAB — CBC WITH DIFFERENTIAL/PLATELET
Basophils Absolute: 0.1 10*3/uL (ref 0.0–0.2)
Basos: 2 %
EOS (ABSOLUTE): 0.4 10*3/uL (ref 0.0–0.4)
Eos: 5 %
Hematocrit: 46.9 % (ref 37.5–51.0)
Hemoglobin: 15.3 g/dL (ref 13.0–17.7)
Immature Grans (Abs): 0 10*3/uL (ref 0.0–0.1)
Immature Granulocytes: 1 %
Lymphocytes Absolute: 2.2 10*3/uL (ref 0.7–3.1)
Lymphs: 29 %
MCH: 31 pg (ref 26.6–33.0)
MCHC: 32.6 g/dL (ref 31.5–35.7)
MCV: 95 fL (ref 79–97)
Monocytes Absolute: 0.6 10*3/uL (ref 0.1–0.9)
Monocytes: 8 %
Neutrophils Absolute: 4.2 10*3/uL (ref 1.4–7.0)
Neutrophils: 55 %
Platelets: 178 10*3/uL (ref 150–450)
RBC: 4.93 x10E6/uL (ref 4.14–5.80)
RDW: 12.9 % (ref 11.6–15.4)
WBC: 7.5 10*3/uL (ref 3.4–10.8)

## 2020-10-22 LAB — COMPREHENSIVE METABOLIC PANEL
ALT: 21 IU/L (ref 0–44)
AST: 24 IU/L (ref 0–40)
Albumin/Globulin Ratio: 2.1 (ref 1.2–2.2)
Albumin: 4.6 g/dL (ref 3.6–4.6)
Alkaline Phosphatase: 86 IU/L (ref 44–121)
BUN/Creatinine Ratio: 8 — ABNORMAL LOW (ref 10–24)
BUN: 8 mg/dL (ref 8–27)
Bilirubin Total: 0.5 mg/dL (ref 0.0–1.2)
CO2: 24 mmol/L (ref 20–29)
Calcium: 9.7 mg/dL (ref 8.6–10.2)
Chloride: 102 mmol/L (ref 96–106)
Creatinine, Ser: 0.95 mg/dL (ref 0.76–1.27)
Globulin, Total: 2.2 g/dL (ref 1.5–4.5)
Glucose: 110 mg/dL — ABNORMAL HIGH (ref 70–99)
Potassium: 5.6 mmol/L — ABNORMAL HIGH (ref 3.5–5.2)
Sodium: 143 mmol/L (ref 134–144)
Total Protein: 6.8 g/dL (ref 6.0–8.5)
eGFR: 80 mL/min/{1.73_m2} (ref >59)

## 2020-10-22 LAB — LIPID PANEL WITH LDL/HDL RATIO
Cholesterol, Total: 191 mg/dL (ref 100–199)
HDL: 36 mg/dL — ABNORMAL LOW (ref >39)
LDL Chol Calc (NIH): 126 mg/dL — ABNORMAL HIGH (ref 0–99)
LDL/HDL Ratio: 3.5 ratio (ref 0.0–3.6)
Triglycerides: 162 mg/dL — ABNORMAL HIGH (ref 0–149)
VLDL Cholesterol Cal: 29 mg/dL (ref 5–40)

## 2020-10-22 LAB — HEMOGLOBIN A1C
Est. average glucose Bld gHb Est-mCnc: 134 mg/dL
Hgb A1c MFr Bld: 6.3 % — ABNORMAL HIGH (ref 4.8–5.6)

## 2020-10-22 LAB — TSH: TSH: 4.17 u[IU]/mL (ref 0.450–4.500)

## 2021-01-07 ENCOUNTER — Other Ambulatory Visit: Payer: Self-pay | Admitting: Family Medicine

## 2021-01-07 DIAGNOSIS — E039 Hypothyroidism, unspecified: Secondary | ICD-10-CM

## 2021-01-29 ENCOUNTER — Other Ambulatory Visit: Payer: Self-pay | Admitting: Family Medicine

## 2021-01-29 DIAGNOSIS — J309 Allergic rhinitis, unspecified: Secondary | ICD-10-CM

## 2021-01-29 NOTE — Telephone Encounter (Signed)
Copied from [place] [id number]. Topic: Quick Communication - Rx Refill/Question >> Jan 29, 2021  2:54 PM Tessa Lerner A wrote: Medication: loratadine (CLARITIN) 10 MG tablet [[id number]]    Has the patient contacted their pharmacy? No. This will be patient's first time using this pharmacy  (Agent: If no, request that the patient contact the pharmacy for the refill. If patient does not wish to contact the pharmacy document the reason why and proceed with request.) (Agent: If yes, when and what did the pharmacy advise?)  Preferred Pharmacy (with phone number or street name): [place], [place] [address] Phone: [phone] Fax: [phone]   Has the patient been seen for an appointment in the last year OR does the patient have an upcoming appointment? Yes.    Agent: Please be advised that RX refills may take up to 3 business days. We ask that you follow-up with your pharmacy.

## 2021-01-29 NOTE — Telephone Encounter (Signed)
Requested medication (s) are due for refill today: yes  Requested medication (s) are on the active medication list: yes  Last refill:  06/04/20  Future visit scheduled: no  Notes to clinic:  Needs signature. Requested Prescriptions  Pending Prescriptions Disp Refills   loratadine (CLARITIN) 10 MG tablet 90 tablet 1     Ear, Nose, and Throat:  Antihistamines Passed - 01/29/2021  3:27 PM      Passed - Valid encounter within last 12 months    Recent Outpatient Visits           3 months ago Annual physical exam   [facility] Jerrol Banana., MD   1 year ago Annual physical exam   [facility] Jerrol Banana., MD   2 years ago Essential (primary) hypertension   [facility] Jerrol Banana., MD   3 years ago Essential (primary) hypertension   [facility] Jerrol Banana., MD   4 years ago Essential (primary) hypertension   [facility] Jerrol Banana., MD

## 2021-01-30 MED ORDER — LORATADINE 10 MG PO TABS: 10.0000 mg | ORAL_TABLET | Freq: Every day | ORAL | 1 refills | Status: DC

## 2021-03-14 ENCOUNTER — Ambulatory Visit (INDEPENDENT_AMBULATORY_CARE_PROVIDER_SITE_OTHER): Payer: Medicare PPO | Admitting: Physician Assistant

## 2021-03-14 ENCOUNTER — Encounter: Payer: Self-pay | Admitting: Physician Assistant

## 2021-03-14 ENCOUNTER — Other Ambulatory Visit: Payer: Self-pay

## 2021-03-14 VITALS — BP 114/68 | HR 54 | Resp 16 | Wt 186.9 lb

## 2021-03-14 DIAGNOSIS — R7303 Prediabetes: Secondary | ICD-10-CM | POA: Diagnosis not present

## 2021-03-14 DIAGNOSIS — E039 Hypothyroidism, unspecified: Secondary | ICD-10-CM | POA: Diagnosis not present

## 2021-03-14 DIAGNOSIS — R5383 Other fatigue: Secondary | ICD-10-CM

## 2021-03-14 LAB — POCT GLYCOSYLATED HEMOGLOBIN (HGB A1C): Hemoglobin A1C: 5.8 % — AB (ref 4.0–5.6)

## 2021-03-14 NOTE — Progress Notes (Signed)
?  ? ? ?Established patient visit ? ? ?Patient: William Khan   DOB: [birth year]   81 y.o. Male  MRN: [id number] ?Visit Date: 03/14/2021 ? ?Today's healthcare provider: [name], PA-C  ? ?Chief Complaint  ?Patient presents with  ? Prediabetes  ? Hypothyroidism  ? Fatigue  ? ?Subjective  ?  ?HPI  ?Prediabetes, Follow-up ? ?Lab Results  ?Component Value Date  ? HGBA1C 5.8 (A) 03/14/2021  ? HGBA1C 6.3 (H) 10/21/2020  ? HGBA1C 6.0 (H) 10/14/2018  ? GLUCOSE 110 (H) 10/21/2020  ? GLUCOSE 101 (H) 10/16/2019  ? GLUCOSE 114 (H) 10/14/2018  ? ? ?Last seen for for this 4 months ago.  ?Management since that visit includes none. ?Current symptoms include none and have been unchanged. ? ?Prior visit with dietician: no ?Current diet:  Patient reports two meals a day.  ?Current exercise: walking ? ?Pertinent Labs: ?   ?Component Value Date/Time  ? CHOL 191 10/21/2020 0941  ? TRIG 162 (H) 10/21/2020 0941  ? CHOLHDL 5.4 (H) 10/16/2019 1038  ? CREATININE 0.95 10/21/2020 0941  ? CREATININE 1.08 09/16/2016 1434  ? ? ?Wt Readings from Last 3 Encounters:  ?03/14/21 186 lb 14.4 oz (84.8 kg)  ?10/21/20 185 lb (83.9 kg)  ?10/16/19 180 lb 6.4 oz (81.8 kg)  ? ? ?-----------------------------------------------------------------------------------------  ? ?Hypothyroid, follow-up ? ?Lab Results  ?Component Value Date  ? TSH 4.170 10/21/2020  ? TSH 4.430 10/16/2019  ? TSH 10.600 (H) 10/14/2018  ? ? ?Wt Readings from Last 3 Encounters:  ?03/14/21 186 lb 14.4 oz (84.8 kg)  ?10/21/20 185 lb (83.9 kg)  ?10/16/19 180 lb 6.4 oz (81.8 kg)  ? ? ?He was last seen for hypothyroid 4 months ago.  ?Management since that visit includes none  ?He reports excellent compliance with treatment. ?He is not having side effects. ? ?Symptoms: ?Yes change in energy level Yes constipation  ?No diarrhea No heat / cold intolerance  ?No nervousness No palpitations  ?Yes weight changes    ? ?-----------------------------------------------------------------------------------------  ?FATIGUE ?Duration:  >3 months ?Severity: moderate  ?Onset: sudden ?Context when symptoms started:  unknown ?Symptoms improve with rest: yes  ?Depressive symptoms: no ?Stress/anxiety: no ?Insomnia: no  ?Snoring: no ?Observed apnea by bed partner: no ?Daytime hypersomnolence:yes ?Wakes feeling refreshed: no ?History of sleep study: no ?Dysnea on exertion:  no ?Orthopnea/PND: no ?Chest pain: no ?Chronic cough: no ?Lower extremity edema: no, patient does report cramping in legs ?Arthralgias:no ?Myalgias: no ?Weakness: yes ?Rash: no ? ?Medications: ?Outpatient Medications Prior to Visit  ?Medication Sig  ? betamethasone dipropionate (DIPROLENE) 0.05 % ointment Apply to affected area BID until clear  ? Cholecalciferol (VITAMIN D3) 1000 UNITS CAPS Take 1,000 Units by mouth daily.   ? DULoxetine (CYMBALTA) 30 MG capsule Take 30 mg by mouth daily.   ? fluticasone (FLONASE) 50 MCG/ACT nasal spray USE TWO SPRAY(S) IN EACH NOSTRIL ONCE DAILY (Patient taking differently: Place 2 sprays into both nostrils as needed.)  ? levothyroxine (SYNTHROID) 50 MCG tablet TAKE 1 TABLET EVERY DAY  ? loratadine (CLARITIN) 10 MG tablet Take 1 tablet (10 mg total) by mouth daily.  ? pravastatin (PRAVACHOL) 40 MG tablet Take 40 mg by mouth daily.   ? primidone (MYSOLINE) 50 MG tablet Take 50 mg by mouth at bedtime.   ? traZODone (DESYREL) 50 MG tablet Take 1 tablet (50 mg total) by mouth at bedtime as needed for sleep.  ? ALPRAZolam (XANAX) 0.5 MG tablet Take 0.5 mg by mouth at bedtime  as needed.  (Patient not taking: Reported on 10/16/2019)  ? busPIRone (BUSPAR) 30 MG tablet Take 30 mg by mouth 2 (two) times daily. (Patient not taking: Reported on 03/14/2021)  ? LORazepam (ATIVAN) 0.5 MG tablet Take 0.5 mg by mouth as needed.  (Patient not taking: Reported on 03/14/2021)  ? mirtazapine (REMERON) 30 MG tablet Take 30 mg by mouth at bedtime.  (Patient not  taking: Reported on 03/14/2021)  ? PARoxetine (PAXIL) 30 MG tablet Take 30 mg by mouth daily. (Patient not taking: Reported on 03/14/2021)  ? propranolol (INDERAL) 20 MG tablet Take 20 mg by mouth 2 (two) times daily. (Patient not taking: Reported on 03/14/2021)  ? QUEtiapine (SEROQUEL) 50 MG tablet  (Patient not taking: Reported on 03/14/2021)  ? risperiDONE (RISPERDAL) 1 MG tablet Take 1 mg by mouth daily.  (Patient not taking: Reported on 03/14/2021)  ? temazepam (RESTORIL) 15 MG capsule Take 15 mg by mouth at bedtime as needed.  (Patient not taking: Reported on 03/14/2021)  ? ?No facility-administered medications prior to visit.  ? ? ?Review of Systems  ?Constitutional: Negative.   ?HENT: Negative.    ?Eyes: Negative.   ?Respiratory: Negative.    ?Cardiovascular: Negative.   ?Gastrointestinal: Negative.   ?All other systems reviewed and are negative. ? ?  Objective  ?  ?BP 114/68   Pulse (!) 54   Resp 16   Wt 186 lb 14.4 oz (84.8 kg)   SpO2 99%   BMI 26.82 kg/m?  ? ? ?Physical Exam ?Vitals and nursing note reviewed.  ?HENT:  ?   Head: Normocephalic and atraumatic.  ?Cardiovascular:  ?   Rate and Rhythm: Rhythm irregular.  ?   Pulses: Normal pulses.  ?Pulmonary:  ?   Effort: Pulmonary effort is normal.  ?   Breath sounds: Rales present.  ?Abdominal:  ?   General: Abdomen is flat. Bowel sounds are normal.  ?   Palpations: Abdomen is soft.  ?Musculoskeletal:  ?   Cervical back: Normal range of motion.  ?   Right lower leg: No edema (right ankle edema, mild).  ?Neurological:  ?   General: No focal deficit present.  ?   Cranial Nerves: No cranial nerve deficit.  ?   Sensory: No sensory deficit.  ?   Motor: No weakness.  ?   Coordination: Coordination normal.  ?   Gait: Gait normal.  ?Psychiatric:     ?   Behavior: Behavior normal.     ?   Thought Content: Thought content normal.     ?   Judgment: Judgment normal.  ?  ? ? ?Results for orders placed or performed in visit on 03/14/21  ?POCT glycosylated hemoglobin (Hb  A1C)  ?Result Value Ref Range  ? Hemoglobin A1C 5.8 (A) 4.0 - 5.6 %  ? HbA1c POC (<> result, manual entry)    ? HbA1c, POC (prediabetic range)    ? HbA1c, POC (controlled diabetic range)    ? ? Assessment & Plan  ?  ? ?1. Pre-diabetes ?POCT A1C 5.8 today vs 6.3 5 mo ago; .not on medication. ?- POCT glycosylated hemoglobin (Hb A1C) ?- Continue lifestyle modifications via diet and exercise routine ? ?2. Fatigue, unspecified type ?Initial workup:crackles on auscultation, fatigue, no chest pain, DOE, cough ?- EKG 12-Lead sinus rhythm of 60 within normal limits ?Has hx of bradycardia, stable ?- DG Chest 2 View; Future ?- Troponin T ?- B Nat Peptide ?- CBC with Differential/Platelet ?- Comprehensive metabolic panel ?- T4,  free ?- TSH ?- Might need echo?Will have an appointment with Cardiology on 3.20.23. Will consult his primary physician. ? ?3. Acquired hypothyroidism ?Last TSH 4.170 , stable ?- TSH&T4 ordered ?-  Continue levothyroxine ?   ?Fu with Dr. Rosanna Randy on 3.23.23 ?The patient was advised to call back or seek an in-person evaluation if the symptoms worsen or if the condition fails to improve as anticipated. ? ?I discussed the assessment and treatment plan with the patient. The patient was provided an opportunity to ask questions and all were answered. The patient agreed with the plan and demonstrated an understanding of the instructions. ? ?The entirety of the information documented in the History of Present Illness, Review of Systems and Physical Exam were personally obtained by me. Portions of this information were initially documented by the CMA and reviewed by me for thoroughness and accuracy.   ? ?[employer] as a Education administrator for [employer], PA-C.,have documented all relevant documentation on the behalf of [name], PA-C,as directed by  [employer], PA-C while in the presence of [employer], PA-C.  ? ?[name], PA-C  ?[place] ?[phone] (phone) ?[phone]  (fax) ? ?[facility] ?

## 2021-03-17 ENCOUNTER — Ambulatory Visit
Admission: RE | Admit: 2021-03-17 | Discharge: 2021-03-17 | Disposition: A | Discharge status: Home / Self Care | Payer: Medicare PPO | Source: Ambulatory Visit | Attending: Physician Assistant | Admitting: Physician Assistant

## 2021-03-17 ENCOUNTER — Other Ambulatory Visit: Payer: Self-pay

## 2021-03-17 ENCOUNTER — Ambulatory Visit
Admission: RE | Admit: 2021-03-17 | Discharge: 2021-03-17 | Disposition: A | Discharge status: Home / Self Care | Payer: Medicare PPO | Attending: Physician Assistant | Admitting: Physician Assistant

## 2021-03-17 DIAGNOSIS — R5383 Other fatigue: Secondary | ICD-10-CM | POA: Insufficient documentation

## 2021-03-18 LAB — CBC WITH DIFFERENTIAL/PLATELET
Basophils Absolute: 0.1 10*3/uL (ref 0.0–0.2)
Basos: 2 %
EOS (ABSOLUTE): 0.3 10*3/uL (ref 0.0–0.4)
Eos: 5 %
Hematocrit: 45.2 % (ref 37.5–51.0)
Hemoglobin: 15.4 g/dL (ref 13.0–17.7)
Immature Grans (Abs): 0 10*3/uL (ref 0.0–0.1)
Immature Granulocytes: 1 %
Lymphocytes Absolute: 2.3 10*3/uL (ref 0.7–3.1)
Lymphs: 37 %
MCH: 31.4 pg (ref 26.6–33.0)
MCHC: 34.1 g/dL (ref 31.5–35.7)
MCV: 92 fL (ref 79–97)
Monocytes Absolute: 0.5 10*3/uL (ref 0.1–0.9)
Monocytes: 9 %
Neutrophils Absolute: 2.9 10*3/uL (ref 1.4–7.0)
Neutrophils: 46 %
Platelets: 187 10*3/uL (ref 150–450)
RBC: 4.9 x10E6/uL (ref 4.14–5.80)
RDW: 12.2 % (ref 11.6–15.4)
WBC: 6.1 10*3/uL (ref 3.4–10.8)

## 2021-03-18 LAB — COMPREHENSIVE METABOLIC PANEL
ALT: 32 IU/L (ref 0–44)
AST: 29 IU/L (ref 0–40)
Albumin/Globulin Ratio: 1.6 (ref 1.2–2.2)
Albumin: 4.5 g/dL (ref 3.6–4.6)
Alkaline Phosphatase: 95 IU/L (ref 44–121)
BUN/Creatinine Ratio: 11 (ref 10–24)
BUN: 11 mg/dL (ref 8–27)
Bilirubin Total: 0.4 mg/dL (ref 0.0–1.2)
CO2: 24 mmol/L (ref 20–29)
Calcium: 9.7 mg/dL (ref 8.6–10.2)
Chloride: 100 mmol/L (ref 96–106)
Creatinine, Ser: 0.97 mg/dL (ref 0.76–1.27)
Globulin, Total: 2.9 g/dL (ref 1.5–4.5)
Glucose: 118 mg/dL — ABNORMAL HIGH (ref 70–99)
Potassium: 5.7 mmol/L — ABNORMAL HIGH (ref 3.5–5.2)
Sodium: 137 mmol/L (ref 134–144)
Total Protein: 7.4 g/dL (ref 6.0–8.5)
eGFR: 78 mL/min/{1.73_m2} (ref >59)

## 2021-03-18 LAB — TSH: TSH: 2.56 u[IU]/mL (ref 0.450–4.500)

## 2021-03-18 LAB — T4, FREE: Free T4: 1.04 ng/dL (ref 0.82–1.77)

## 2021-03-18 LAB — BRAIN NATRIURETIC PEPTIDE: BNP: 45.5 pg/mL (ref 0.0–100.0)

## 2021-03-18 LAB — TROPONIN T: Troponin T (Highly Sensitive): 17 ng/L (ref 0–22)

## 2021-03-27 ENCOUNTER — Ambulatory Visit: Payer: Medicare Other | Admitting: Family Medicine

## 2021-04-15 ENCOUNTER — Ambulatory Visit (INDEPENDENT_AMBULATORY_CARE_PROVIDER_SITE_OTHER): Payer: Medicare PPO | Admitting: Physician Assistant

## 2021-04-15 ENCOUNTER — Encounter: Payer: Self-pay | Admitting: Physician Assistant

## 2021-04-15 VITALS — BP 128/71 | HR 60 | Temp 97.6°F | Resp 14 | Wt 178.0 lb

## 2021-04-15 DIAGNOSIS — R059 Cough, unspecified: Secondary | ICD-10-CM

## 2021-04-15 DIAGNOSIS — R5383 Other fatigue: Secondary | ICD-10-CM | POA: Diagnosis not present

## 2021-04-15 DIAGNOSIS — R7303 Prediabetes: Secondary | ICD-10-CM

## 2021-04-15 DIAGNOSIS — T7840XS Allergy, unspecified, sequela: Secondary | ICD-10-CM | POA: Diagnosis not present

## 2021-04-15 DIAGNOSIS — E039 Hypothyroidism, unspecified: Secondary | ICD-10-CM | POA: Diagnosis not present

## 2021-04-15 MED ORDER — FLUTICASONE PROPIONATE 50 MCG/ACT NA SUSP: 2.0000 | Freq: Every day | NASAL | 12 refills | Status: DC

## 2021-04-15 NOTE — Progress Notes (Signed)
?  ? ? ?I,Roshena L Chambers,acting as a Education administrator for [employer], PA-C.,have documented all relevant documentation on the behalf of [name], PA-C,as directed by  [employer], PA-C while in the presence of [employer], PA-C. ? ? ? ?Established patient visit ? ? ?Patient: William Khan   DOB: 21-Apr-1940   81 y.o. Male  MRN: [id number] ?Visit Date: 04/15/2021 ? ?Today's healthcare provider: [name], PA-C  ? ?Chief Complaint  ?Patient presents with  ? Follow-up  ? ?Subjective  ?  ?HPI  ?Follow up for Fatigue: ? ?The patient was last seen for this 03/14/2021.   ?During that visit EKG was obtained and labs were ordered. We discussed the results of his blood work during this visit. All labs results were WNL except A1C and blood sugar. ? ?Patient reports that his fatigue improved after symptomatic treatment of his bronchitis. CXR from 03/14/21 showed bronchitis or atypical infection. ?Patient states that he feels much better. ?He reports good compliance with treatment. ?He feels that condition is Improved. ?He is not having side effects.  ? ?-----------------------------------------------------------------------------------------  ?Patient asked for Flonase refill as his allergy flares up ? ?Denies having cold/heat intolerance, hair loss. Takes levothyroxine. Last TSH and T4 were WNL. ?Denies having CP, SOB, palpitation, leg swelling. ?Adheres to a healthy diet ? ? ?Medications: ?Outpatient Medications Prior to Visit  ?Medication Sig  ? betamethasone dipropionate (DIPROLENE) 0.05 % ointment Apply to affected area BID until clear  ? Cholecalciferol (VITAMIN D3) 1000 UNITS CAPS Take 1,000 Units by mouth daily.   ? DULoxetine (CYMBALTA) 30 MG capsule Take 30 mg by mouth daily.   ? fluticasone (FLONASE) 50 MCG/ACT nasal spray USE TWO SPRAY(S) IN EACH NOSTRIL ONCE DAILY (Patient taking differently: Place 2 sprays into both nostrils as needed.)  ? levothyroxine (SYNTHROID) 50 MCG tablet TAKE 1 TABLET EVERY DAY  ?  loratadine (CLARITIN) 10 MG tablet Take 1 tablet (10 mg total) by mouth daily.  ? LORazepam (ATIVAN) 0.5 MG tablet Take 0.5 mg by mouth as needed.  ? mirtazapine (REMERON) 30 MG tablet Take 30 mg by mouth at bedtime.  ? PARoxetine (PAXIL) 30 MG tablet Take 30 mg by mouth daily.  ? pravastatin (PRAVACHOL) 40 MG tablet Take 40 mg by mouth daily.   ? primidone (MYSOLINE) 50 MG tablet Take 50 mg by mouth at bedtime.   ? propranolol (INDERAL) 20 MG tablet Take 20 mg by mouth 2 (two) times daily.  ? QUEtiapine (SEROQUEL) 50 MG tablet   ? risperiDONE (RISPERDAL) 1 MG tablet Take 1 mg by mouth daily.  ? temazepam (RESTORIL) 15 MG capsule Take 15 mg by mouth at bedtime as needed.  ? traZODone (DESYREL) 50 MG tablet Take 1 tablet (50 mg total) by mouth at bedtime as needed for sleep.  ? ALPRAZolam (XANAX) 0.5 MG tablet Take 0.5 mg by mouth at bedtime as needed.  (Patient not taking: Reported on 10/16/2019)  ? busPIRone (BUSPAR) 30 MG tablet Take 30 mg by mouth 2 (two) times daily. (Patient not taking: Reported on 03/14/2021)  ? ?No facility-administered medications prior to visit.  ? ? ?Review of Systems  ?Constitutional:  Negative for appetite change, chills and fever.  ?Respiratory:  Negative for chest tightness, shortness of breath and wheezing.   ?Cardiovascular:  Negative for chest pain and palpitations.  ?Gastrointestinal:  Negative for abdominal pain, nausea and vomiting.  ? ? ?  Objective  ?  ?BP 128/71 (BP Location: Left Arm, Patient Position: Sitting, Cuff  Size: Normal)   Pulse 60   Temp 97.6 ?F (36.4 ?C) (Oral)   Resp 14   Wt 178 lb (80.7 kg)   SpO2 100% Comment: room air  BMI 25.54 kg/m?  ? ? ?Physical Exam ?Vitals and nursing note reviewed.  ?Constitutional:   ?   Appearance: Normal appearance. He is normal weight.  ?HENT:  ?   Head: Normocephalic and atraumatic.  ?Cardiovascular:  ?   Rate and Rhythm: Normal rate and regular rhythm.  ?   Pulses: Normal pulses.  ?   Heart sounds: Normal heart sounds.   ?Pulmonary:  ?   Effort: Pulmonary effort is normal.  ?   Breath sounds: Normal breath sounds.  ?Abdominal:  ?   General: Abdomen is flat. Bowel sounds are normal.  ?   Palpations: Abdomen is soft.  ?Neurological:  ?   Mental Status: He is alert.  ?  ? ? ?No results found for any visits on 04/15/21. ? Assessment & Plan  ?  ? ?1. Allergy, sequela ?Med refill ?- fluticasone (FLONASE) 50 MCG/ACT nasal spray; Place 2 sprays into both nostrils daily.  Dispense: 16 g; Refill: 12 ? ?2. Fatigue, unspecified type ?Patient feels better after resolution of bronchitis ?Normal lung exam, no chest pain, DOE, cough ?Patient is a former smoker(quit date: 01/05/1990) ?All labs were stable and WNL except A1C 5.8 ( vs 6.3 - 5 mo ago) ?We discussed his lab results in details at this visit.  ?Patient expressed his understanding ? ?CXR from 03/17/21 showed  ?"Mild interstitial and bronchial thickening, progressed from prior ?exam. Findings may represent bronchitis or atypical infection" ? ? ?3. Acquired hypothyroidism ?Stable, chronic ?TSH and T4 WNL ?Continue Levothyroxine ? ?4. Prediabetes ?Stable. A1C 5.8 ( vs 6.3 - 5 mo ago) ?Continue lifestyle modifications ?Instructions for DM diet was provided ? ? ?FU as needed ?   ?The patient was advised to call back or seek an in-person evaluation if the symptoms worsen or if the condition fails to improve as anticipated. ? ?I discussed the assessment and treatment plan with the patient. The patient was provided an opportunity to ask questions and all were answered. The patient agreed with the plan and demonstrated an understanding of the instructions. ? ?The entirety of the information documented in the History of Present Illness, Review of Systems and Physical Exam were personally obtained by me. Portions of this information were initially documented by the CMA and reviewed by me for thoroughness and accuracy.   ? ? ?[name], PA-C  ?[place] ?[phone]  (phone) ?[phone] (fax) ? ?[facility]  ?

## 2021-04-30 ENCOUNTER — Ambulatory Visit: Payer: Self-pay | Admitting: *Deleted

## 2021-04-30 NOTE — Telephone Encounter (Signed)
Per agent: ?"Pt had an appt with Janna BFP on 4/11. Pt has coughing, hoarseness and states he has bronchitis. Pt says that [place] said she could prescribe him something or he could get it over the counter. Pt states he can not remember the name and that anyway he had rather her just call it in as it seems it is not getting better,  still cough up stuff,not worse but cant get rid of it. He just wanted her to go ahead and call it in. [facility]  ?[place], [place] - [place]  ?[address] [place] [address]  ?Phone: [phone] Fax: [phone]  ?Hours: Not open 24 hour"  ? ? ?Chief Complaint: Cough ?Symptoms: States cough worsening, intermittent wheezing at times,"Can't get anything up today." ?Frequency: Seen 04/15/21 ?Pertinent Negatives: Patient denies SOB, fever ?Disposition: '[]'$ ED /'[]'$ Urgent Care (no appt availability in office) / '[x]'$ Appointment(In office/virtual)/ '[]'$  [facility]/ '[]'$ Home Care/ '[]'$ Refused Recommended Disposition /'[]'$ [facility]/ '[x]'$  Follow-up with PCP ?Additional Notes: Pt calling to request med as discussed at [place]. Did state cough worsening, secured appt for Friday and placed on Waitlist. Call was after hours. Please assess if pt can possibly be fit in tomorrow. Advised UC/ED for worsening symptoms. Pt verbalizes understanding.  ? ?Reason for Disposition ? [1] Continuous (nonstop) coughing interferes with work or school AND [2] no improvement using cough treatment per Care Advice ? ?Answer Assessment - Initial Assessment Questions ?1. ONSET: "When did the cough begin?"  ?    Saw Letitia Libra 4/11. ?2. SEVERITY: "How bad is the cough today?"  ?    Bad spells ?3. SPUTUM: "Describe the color of your sputum" (none, dry cough; clear, white, yellow, green) ?    "A lot" But can't get it up today" ?4. HEMOPTYSIS: "Are you coughing up any blood?" If so ask: "How much?" (flecks, streaks, tablespoons, etc.) ?    no ?5. DIFFICULTY BREATHING: "Are you having  difficulty breathing?" If Yes, ask: "How bad is it?" (e.g., mild, moderate, severe)  ?  - MILD: No SOB at rest, mild SOB with walking, speaks normally in sentences, can lie down, no retractions, pulse < 100.  ?  - MODERATE: SOB at rest, SOB with minimal exertion and prefers to sit, cannot lie down flat, speaks in phrases, mild retractions, audible wheezing, pulse 100-120.  ?  - SEVERE: Very SOB at rest, speaks in single words, struggling to breathe, sitting hunched forward, retractions, pulse > 120  ?   Denies  ?6. FEVER: "Do you have a fever?" If Yes, ask: "What is your temperature, how was it measured, and when did it start?" ?No ?10. OTHER SYMPTOMS: "Do you have any other symptoms?" (e.g., runny nose, wheezing, chest pain) ?      Wheezing ? ?Protocols used: Cough - Acute Productive-A-AH ? ?

## 2021-05-01 NOTE — Progress Notes (Signed)
?  ? ?I,Greyden Besecker Robinson,acting as a Education administrator for [employer], PA-C.,have documented all relevant documentation on the behalf of [name], PA-C,as directed by  [employer], PA-C while in the presence of [employer], PA-C. ? ? ?Established patient visit ? ? ?Patient: William Khan   DOB: 1940-03-14   81 y.o. Male  MRN: [id number] ?Visit Date: 05/02/2021 ? ?Today's healthcare provider: [name], PA-C  ? ?Chief Complaint  ?Patient presents with  ? Cough  ? Wheezing  ? ?Subjective  ?  ?HPI  ?Cough/wheezing ?He complains of cough described as nonproductive.with no fever, chills, night sweats or weight loss. Onset of symptoms was a few days ago and staying constant.He is drinking plenty of fluids.  Past history is significant for bronchiectasis. Patient is non-smoker. Reports he hears wheezing in his chest, but nothing comes up. Taking Mucinex per pharmacists advice but cannot tell that it's helping.  Patient denies chest pain and shortness of breath.   ? ? ?Medications: ?Outpatient Medications Prior to Visit  ?Medication Sig  ? ALPRAZolam (XANAX) 0.5 MG tablet Take 0.5 mg by mouth at bedtime as needed.  ? betamethasone dipropionate (DIPROLENE) 0.05 % ointment Apply to affected area BID until clear  ? busPIRone (BUSPAR) 30 MG tablet Take 30 mg by mouth 2 (two) times daily.  ? Cholecalciferol (VITAMIN D3) 1000 UNITS CAPS Take 1,000 Units by mouth daily.   ? DULoxetine (CYMBALTA) 30 MG capsule Take 30 mg by mouth daily.   ? fluticasone (FLONASE) 50 MCG/ACT nasal spray Place 2 sprays into both nostrils daily.  ? levothyroxine (SYNTHROID) 50 MCG tablet TAKE 1 TABLET EVERY DAY  ? loratadine (CLARITIN) 10 MG tablet Take 1 tablet (10 mg total) by mouth daily.  ? LORazepam (ATIVAN) 0.5 MG tablet Take 0.5 mg by mouth as needed.  ? mirtazapine (REMERON) 30 MG tablet Take 30 mg by mouth at bedtime.  ? PARoxetine (PAXIL) 30 MG tablet Take 30 mg by mouth daily.  ? pravastatin (PRAVACHOL) 40 MG tablet Take 40 mg by mouth  daily.   ? primidone (MYSOLINE) 50 MG tablet Take 50 mg by mouth at bedtime.   ? propranolol (INDERAL) 20 MG tablet Take 20 mg by mouth 2 (two) times daily.  ? QUEtiapine (SEROQUEL) 50 MG tablet   ? risperiDONE (RISPERDAL) 1 MG tablet Take 1 mg by mouth daily.  ? temazepam (RESTORIL) 15 MG capsule Take 15 mg by mouth at bedtime as needed.  ? traZODone (DESYREL) 50 MG tablet Take 1 tablet (50 mg total) by mouth at bedtime as needed for sleep.  ? ?No facility-administered medications prior to visit.  ? ? ?Review of Systems  ?Constitutional:  Positive for chills. Negative for fever.  ?HENT:  Positive for congestion and rhinorrhea. Negative for sinus pressure.   ?Respiratory:  Positive for cough, chest tightness and wheezing.   ?Cardiovascular:  Negative for chest pain, palpitations and leg swelling.  ? ?  Objective  ?  ?BP 122/61 (BP Location: Left Arm, Patient Position: Sitting, Cuff Size: Normal)   Pulse 69   Temp 98.6 ?F (37 ?C) (Oral)   Resp 16   Wt 176 lb (79.8 kg)   SpO2 96%   BMI 25.25 kg/m?  ? ? ?Physical Exam ?Vitals and nursing note reviewed.  ?Constitutional:   ?   General: He is in acute distress.  ?   Appearance: Normal appearance.  ?HENT:  ?   Head: Normocephalic and atraumatic.  ?   Right Ear: There is  impacted cerumen (partially impacted).  ?   Left Ear: There is impacted cerumen (partially impacted).  ?   Nose: Congestion present. No rhinorrhea.  ?   Mouth/Throat:  ?   Mouth: Mucous membranes are moist.  ?Eyes:  ?   Extraocular Movements: Extraocular movements intact.  ?   Conjunctiva/sclera: Conjunctivae normal.  ?   Pupils: Pupils are equal, round, and reactive to light.  ?Cardiovascular:  ?   Rate and Rhythm: Normal rate and regular rhythm.  ?   Pulses: Normal pulses.  ?Pulmonary:  ?   Breath sounds: Wheezing present.  ?Neurological:  ?   Mental Status: He is alert and oriented to person, place, and time.  ?Psychiatric:     ?   Behavior: Behavior normal.     ?   Thought Content: Thought  content normal.     ?   Judgment: Judgment normal.  ?  ? ? Assessment & Plan  ?  ? ?1. Cough, unspecified type ?Due to suspected exacerbation of chronic bronchitis, atypical pneumonia, URI ?Per patient, has bronchiectasis.  ?- DG Chest 2 View; Future ?- doxycycline (VIBRA-TABS) 100 MG tablet; Take 1 tablet (100 mg total) by mouth 2 (two) times daily.  Dispense: 14 tablet; Refill: 0 ?- benzonatate (TESSALON PERLES) 100 MG capsule; Take 1 capsule (100 mg total) by mouth 3 (three) times daily as needed for cough.  Dispense: 20 capsule; Refill: 0 ? ?2. Wheezing ?Due to possible asthma, bronchitis, allergy, pneumonia ?- predniSONE (DELTASONE) 20 MG tablet; Take 1 tablet (20 mg total) by mouth 2 (two) times daily with a meal.  Dispense: 10 tablet; Refill: 0 ?- DG Chest 2 View; Future ?- doxycycline (VIBRA-TABS) 100 MG tablet; Take 1 tablet (100 mg total) by mouth 2 (two) times daily.  Dispense: 14 tablet; Refill: 0 ?Recommended to use a humidifier, take a steamy shower or sit in the bathroom with the door closed while running a hot shower. Drink a lot of warm fluids.  ? ?FU as scheduled ? ?The patient was advised to call back or seek an in-person evaluation if the symptoms worsen or if the condition fails to improve as anticipated. ? ?I discussed the assessment and treatment plan with the patient. The patient was provided an opportunity to ask questions and all were answered. The patient agreed with the plan and demonstrated an understanding of the instructions. ? ?The entirety of the information documented in the History of Present Illness, Review of Systems and Physical Exam were personally obtained by me. Portions of this information were initially documented by the CMA and reviewed by me for thoroughness and accuracy.  ? ? ?Portions of this note were created using dictation software and may contain typographical errors.   ? ?[name], PA-C  ?[place] ?[phone] (phone) ?[phone]  (fax) ? ?[facility] ?

## 2021-05-02 ENCOUNTER — Ambulatory Visit
Admission: RE | Admit: 2021-05-02 | Discharge: 2021-05-02 | Disposition: A | Discharge status: Home / Self Care | Payer: Medicare PPO | Source: Ambulatory Visit | Attending: Physician Assistant | Admitting: Physician Assistant

## 2021-05-02 ENCOUNTER — Encounter: Payer: Self-pay | Admitting: Physician Assistant

## 2021-05-02 ENCOUNTER — Ambulatory Visit (INDEPENDENT_AMBULATORY_CARE_PROVIDER_SITE_OTHER): Payer: Medicare PPO | Admitting: Physician Assistant

## 2021-05-02 ENCOUNTER — Ambulatory Visit
Admission: RE | Admit: 2021-05-02 | Discharge: 2021-05-02 | Disposition: A | Discharge status: Home / Self Care | Payer: Medicare PPO | Attending: Physician Assistant | Admitting: Physician Assistant

## 2021-05-02 VITALS — BP 122/61 | HR 69 | Temp 98.6°F | Resp 16 | Wt 176.0 lb

## 2021-05-02 DIAGNOSIS — R062 Wheezing: Secondary | ICD-10-CM | POA: Diagnosis not present

## 2021-05-02 DIAGNOSIS — J42 Unspecified chronic bronchitis: Secondary | ICD-10-CM

## 2021-05-02 DIAGNOSIS — R053 Chronic cough: Secondary | ICD-10-CM | POA: Insufficient documentation

## 2021-05-02 DIAGNOSIS — R059 Cough, unspecified: Secondary | ICD-10-CM

## 2021-05-02 DIAGNOSIS — T7840XS Allergy, unspecified, sequela: Secondary | ICD-10-CM

## 2021-05-02 DIAGNOSIS — J069 Acute upper respiratory infection, unspecified: Secondary | ICD-10-CM

## 2021-05-02 DIAGNOSIS — J4520 Mild intermittent asthma, uncomplicated: Secondary | ICD-10-CM

## 2021-05-02 MED ORDER — BENZONATATE 100 MG PO CAPS: 100.0000 mg | ORAL_CAPSULE | Freq: Three times a day (TID) | ORAL | 0 refills | Status: DC | PRN

## 2021-05-02 MED ORDER — DOXYCYCLINE HYCLATE 100 MG PO TABS: 100.0000 mg | ORAL_TABLET | Freq: Two times a day (BID) | ORAL | 0 refills | Status: DC

## 2021-05-02 MED ORDER — PREDNISONE 20 MG PO TABS: 20.0000 mg | ORAL_TABLET | Freq: Two times a day (BID) | ORAL | 0 refills | Status: DC

## 2021-05-02 NOTE — Telephone Encounter (Signed)
Pt seen in office this morning.  ?

## 2021-06-12 NOTE — Progress Notes (Deleted)
I,Jana Zamariyah Furukawa,acting as a Education administrator for [employer], PA-C.,have documented all relevant documentation on the behalf of [name], PA-C,as directed by  [employer], PA-C while in the presence of [employer], PA-C.  Established patient visit   Patient: William Khan   DOB: 1940/11/13   81 y.o. Male  MRN: [id number] Visit Date: 06/13/2021  Today's healthcare provider: [name], PA-C   No chief complaint on file.  Subjective    Follow up for cough  The patient was last seen for this 10 days ago. Changes made at last visit include- DG Chest 2 View; Future - doxycycline (VIBRA-TABS) 100 MG tablet; Take 1 tablet (100 mg total) by mouth 2 (two) times daily.  Dispense: 14 tablet; Refill: 0 - benzonatate (TESSALON PERLES) 100 MG capsule; Take 1 capsule (100 mg total) by mouth 3 (three) times daily as needed for cough. .  He reports {excellent/good/fair/poor:19665} compliance with treatment. He feels that condition is {improved/worse/unchanged:3041574}. He {is/is not:21021397} having side effects. ***  -----------------------------------------------------------------------------------------  Follow up for wheezing  The patient was last seen for this 10 days ago. Changes made at last visit include - DG Chest 2 View; Future - doxycycline (VIBRA-TABS) 100 MG tablet; Take 1 tablet (100 mg total) by mouth 2 (two) times daily.  Dispense: 14 tablet; Refill: 0 Recommended to use a humidifier, take a steamy shower or sit in the bathroom with the door closed while running a hot shower. Drink a lot of warm fluids. Marland Kitchen  He reports {excellent/good/fair/poor:19665} compliance with treatment. He feels that condition is {improved/worse/unchanged:3041574}. He {is/is not:21021397} having side effects. ***  -----------------------------------------------------------------------------------------  Medications: Outpatient Medications Prior to Visit  Medication Sig   ALPRAZolam (XANAX) 0.5  MG tablet Take 0.5 mg by mouth at bedtime as needed.   benzonatate (TESSALON PERLES) 100 MG capsule Take 1 capsule (100 mg total) by mouth 3 (three) times daily as needed for cough.   betamethasone dipropionate (DIPROLENE) 0.05 % ointment Apply to affected area BID until clear   betamethasone dipropionate (DIPROLENE) 0.05 % ointment Apply topically.   busPIRone (BUSPAR) 30 MG tablet Take 30 mg by mouth 2 (two) times daily.   Cholecalciferol (VITAMIN D3) 1000 UNITS CAPS Take 1,000 Units by mouth daily.    doxycycline (VIBRA-TABS) 100 MG tablet Take 1 tablet (100 mg total) by mouth 2 (two) times daily.   DULoxetine (CYMBALTA) 30 MG capsule Take 30 mg by mouth daily.    fluticasone (FLONASE) 50 MCG/ACT nasal spray Place 2 sprays into both nostrils daily.   levothyroxine (SYNTHROID) 50 MCG tablet TAKE 1 TABLET EVERY DAY   loratadine (CLARITIN) 10 MG tablet Take 1 tablet (10 mg total) by mouth daily.   LORazepam (ATIVAN) 0.5 MG tablet Take 0.5 mg by mouth as needed.   mirtazapine (REMERON) 30 MG tablet Take 30 mg by mouth at bedtime.   PARoxetine (PAXIL) 30 MG tablet Take 30 mg by mouth daily.   pravastatin (PRAVACHOL) 40 MG tablet Take 40 mg by mouth daily.    predniSONE (DELTASONE) 20 MG tablet Take 1 tablet (20 mg total) by mouth 2 (two) times daily with a meal.   primidone (MYSOLINE) 50 MG tablet Take 50 mg by mouth at bedtime.    primidone (MYSOLINE) 50 MG tablet Take 1 tablet by mouth 2 (two) times daily.   propranolol (INDERAL) 20 MG tablet Take 20 mg by mouth 2 (two) times daily.   QUEtiapine (SEROQUEL) 50 MG tablet    risperiDONE (  RISPERDAL) 1 MG tablet Take 1 mg by mouth daily.   rOPINIRole (REQUIP) 0.5 MG tablet Take 0.5 mg by mouth 2 (two) times daily.   temazepam (RESTORIL) 15 MG capsule Take 15 mg by mouth at bedtime as needed.   traZODone (DESYREL) 50 MG tablet Take 1 tablet (50 mg total) by mouth at bedtime as needed for sleep.   No facility-administered medications prior to  visit.    Review of Systems  {Labs  Heme  Chem  Endocrine  Serology  Results Review (optional):23779}   Objective    There were no vitals taken for this visit. {Show previous vital signs (optional):23777}  Physical Exam  ***  No results found for any visits on 06/13/21.  Assessment & Plan     ***  No follow-ups on file.      {provider attestation***:1}   [name], Hershal Coria  [facility] [phone] (phone) [phone] (fax)  [place]

## 2021-06-13 ENCOUNTER — Ambulatory Visit: Payer: Medicare PPO | Admitting: Physician Assistant

## 2021-07-07 ENCOUNTER — Other Ambulatory Visit: Payer: Self-pay | Admitting: Family Medicine

## 2021-07-07 DIAGNOSIS — J309 Allergic rhinitis, unspecified: Secondary | ICD-10-CM

## 2021-07-25 ENCOUNTER — Telehealth: Payer: Self-pay | Admitting: Family Medicine

## 2021-07-25 NOTE — Telephone Encounter (Signed)
Copied from [place] [phone]. Topic: Medicare AWV >> Jul 25, 2021  2:44 PM Jae Dire wrote: Reason for CRM:  Left message for patient to call back and schedule Medicare Annual Wellness Visit (AWV) in office.   If unable to come into the office for AWV,  please offer to do virtually or by telephone.  Last AWV: 10/16/2019  Please schedule at anytime with [facility] - Inpatient Health Advisor.  30 minute appointment for Virtual or phone 45 minute appointment for in office or Initial virtual/phone  Any questions, please contact me at [phone]

## 2021-08-06 ENCOUNTER — Ambulatory Visit (INDEPENDENT_AMBULATORY_CARE_PROVIDER_SITE_OTHER): Payer: Medicare PPO

## 2021-08-06 VITALS — Wt 176.0 lb

## 2021-08-06 DIAGNOSIS — Z0000 Encounter for general adult medical examination without abnormal findings: Secondary | ICD-10-CM | POA: Diagnosis not present

## 2021-08-06 NOTE — Patient Instructions (Signed)
[name] , Thank you for taking time to come for your Medicare Wellness Visit. I appreciate your ongoing commitment to your health goals. Please review the following plan we discussed and let me know if I can assist you in the future.   Screening recommendations/referrals: Colonoscopy: AGED OUT Recommended yearly ophthalmology/optometry visit for glaucoma screening and checkup Recommended yearly dental visit for hygiene and checkup  Vaccinations: Influenza vaccine: 10/05/20 Pneumococcal vaccine: 12/04/13 Tdap vaccine: 10/21/20 Shingles vaccine: [facility] 08/04/18, 11/09/18   Covid-19: 01/11/19, 02/04/19  Advanced directives: NO  Conditions/risks identified: NONE  Next appointment: Follow up in one year for your annual wellness visit. 08/10/22 @ 2:45 PM BY PHONE  Preventive Care 65 Years and Older, Male Preventive care refers to lifestyle choices and visits with your health care provider that can promote health and wellness. What does preventive care include? A yearly physical exam. This is also called an annual well check. Dental exams once or twice a year. Routine eye exams. Ask your health care provider how often you should have your eyes checked. Personal lifestyle choices, including: Daily care of your teeth and gums. Regular physical activity. Eating a healthy diet. Avoiding tobacco and drug use. Limiting alcohol use. Practicing safe sex. Taking low doses of aspirin every day. Taking vitamin and mineral supplements as recommended by your health care provider. What happens during an annual well check? The services and screenings done by your health care provider during your annual well check will depend on your age, overall health, lifestyle risk factors, and family history of disease. Counseling  Your health care provider may ask you questions about your: Alcohol use. Tobacco use. Drug use. Emotional well-being. Home and relationship well-being. Sexual activity. Eating  habits. History of falls. Memory and ability to understand (cognition). Work and work Statistician. Screening  You may have the following tests or measurements: Height, weight, and BMI. Blood pressure. Lipid and cholesterol levels. These may be checked every 5 years, or more frequently if you are over 78 years old. Skin check. Lung cancer screening. You may have this screening every year starting at age 39 if you have a 30-pack-year history of smoking and currently smoke or have quit within the past 15 years. Fecal occult blood test (FOBT) of the stool. You may have this test every year starting at age 60. Flexible sigmoidoscopy or colonoscopy. You may have a sigmoidoscopy every 5 years or a colonoscopy every 10 years starting at age 24. Prostate cancer screening. Recommendations will vary depending on your family history and other risks. Hepatitis C blood test. Hepatitis B blood test. Sexually transmitted disease (STD) testing. Diabetes screening. This is done by checking your blood sugar (glucose) after you have not eaten for a while (fasting). You may have this done every 1-3 years. Abdominal aortic aneurysm (AAA) screening. You may need this if you are a current or former smoker. Osteoporosis. You may be screened starting at age 54 if you are at high risk. Talk with your health care provider about your test results, treatment options, and if necessary, the need for more tests. Vaccines  Your health care provider may recommend certain vaccines, such as: Influenza vaccine. This is recommended every year. Tetanus, diphtheria, and acellular pertussis (Tdap, Td) vaccine. You may need a Td booster every 10 years. Zoster vaccine. You may need this after age 15. Pneumococcal 13-valent conjugate (PCV13) vaccine. One dose is recommended after age 14. Pneumococcal polysaccharide (PPSV23) vaccine. One dose is recommended after age 69. Talk to your  health care provider about which screenings and  vaccines you need and how often you need them. This information is not intended to replace advice given to you by your health care provider. Make sure you discuss any questions you have with your health care provider. Document Released: 01/18/2015 Document Revised: 09/11/2015 Document Reviewed: 10/23/2014 Elsevier Interactive Patient Education  2017 [employer] Prevention in the Home Falls can cause injuries. They can happen to people of all ages. There are many things you can do to make your home safe and to help prevent falls. What can I do on the outside of my home? Regularly fix the edges of walkways and driveways and fix any cracks. Remove anything that might make you trip as you walk through a door, such as a raised step or threshold. Trim any bushes or trees on the path to your home. Use bright outdoor lighting. Clear any walking paths of anything that might make someone trip, such as rocks or tools. Regularly check to see if handrails are loose or broken. Make sure that both sides of any steps have handrails. Any raised decks and porches should have guardrails on the edges. Have any leaves, snow, or ice cleared regularly. Use sand or salt on walking paths during winter. Clean up any spills in your garage right away. This includes oil or grease spills. What can I do in the bathroom? Use night lights. Install grab bars by the toilet and in the tub and shower. Do not use towel bars as grab bars. Use non-skid mats or decals in the tub or shower. If you need to sit down in the shower, use a plastic, non-slip stool. Keep the floor dry. Clean up any water that spills on the floor as soon as it happens. Remove soap buildup in the tub or shower regularly. Attach bath mats securely with double-sided non-slip rug tape. Do not have throw rugs and other things on the floor that can make you trip. What can I do in the bedroom? Use night lights. Make sure that you have a light by your  bed that is easy to reach. Do not use any sheets or blankets that are too big for your bed. They should not hang down onto the floor. Have a firm chair that has side arms. You can use this for support while you get dressed. Do not have throw rugs and other things on the floor that can make you trip. What can I do in the kitchen? Clean up any spills right away. Avoid walking on wet floors. Keep items that you use a lot in easy-to-reach places. If you need to reach something above you, use a strong step stool that has a grab bar. Keep electrical cords out of the way. Do not use floor polish or wax that makes floors slippery. If you must use wax, use non-skid floor wax. Do not have throw rugs and other things on the floor that can make you trip. What can I do with my stairs? Do not leave any items on the stairs. Make sure that there are handrails on both sides of the stairs and use them. Fix handrails that are broken or loose. Make sure that handrails are as long as the stairways. Check any carpeting to make sure that it is firmly attached to the stairs. Fix any carpet that is loose or worn. Avoid having throw rugs at the top or bottom of the stairs. If you do have throw rugs, attach them  to the floor with carpet tape. Make sure that you have a light switch at the top of the stairs and the bottom of the stairs. If you do not have them, ask someone to add them for you. What else can I do to help prevent falls? Wear shoes that: Do not have high heels. Have rubber bottoms. Are comfortable and fit you well. Are closed at the toe. Do not wear sandals. If you use a stepladder: Make sure that it is fully opened. Do not climb a closed stepladder. Make sure that both sides of the stepladder are locked into place. Ask someone to hold it for you, if possible. Clearly mark and make sure that you can see: Any grab bars or handrails. First and last steps. Where the edge of each step is. Use tools that  help you move around (mobility aids) if they are needed. These include: Canes. Walkers. Scooters. Crutches. Turn on the lights when you go into a dark area. Replace any light bulbs as soon as they burn out. Set up your furniture so you have a clear path. Avoid moving your furniture around. If any of your floors are uneven, fix them. If there are any pets around you, be aware of where they are. Review your medicines with your doctor. Some medicines can make you feel dizzy. This can increase your chance of falling. Ask your doctor what other things that you can do to help prevent falls. This information is not intended to replace advice given to you by your health care provider. Make sure you discuss any questions you have with your health care provider. Document Released: 10/18/2008 Document Revised: 05/30/2015 Document Reviewed: 01/26/2014 Elsevier Interactive Patient Education  2017 [employer].

## 2021-08-06 NOTE — Progress Notes (Signed)
Virtual Visit via Telephone Note  I connected with  William Khan on 08/06/21 at  3:00 PM EDT by telephone and verified that I am speaking with the correct person using two identifiers.  Location: Patient: home Provider: [facility] Persons participating in the virtual visit: [facility]   I discussed the limitations, risks, security and privacy concerns of performing an evaluation and management service by telephone and the availability of in person appointments. The patient expressed understanding and agreed to proceed.  Interactive audio and video telecommunications were attempted between this nurse and patient, however failed, due to patient having technical difficulties OR patient did not have access to video capability.  We continued and completed visit with audio only.  Some vital signs may be absent or patient reported.   William David, LPN  Subjective:   William Khan is a 81 y.o. male who presents for Medicare Annual/Subsequent preventive examination.  Review of Systems           Objective:    There were no vitals filed for this visit. There is no height or weight on file to calculate BMI.     10/16/2019    8:47 AM 09/22/2018    1:37 PM 09/20/2017    1:34 PM 09/16/2016    1:43 PM 05/25/2016    9:50 AM  Advanced Directives  Does Patient Have a Medical Advance Directive? No No No No No  Would patient like information on creating a medical advance directive? No - Patient declined No - Patient declined No - Patient declined No - Patient declined No - Patient declined    Current Medications (verified) Outpatient Encounter Medications as of 08/06/2021  Medication Sig   betamethasone dipropionate (DIPROLENE) 0.05 % ointment Apply to affected area BID until clear   betamethasone dipropionate (DIPROLENE) 0.05 % ointment Apply topically.   DULoxetine (CYMBALTA) 30 MG capsule Take 30 mg by mouth daily.    levothyroxine (SYNTHROID) 50 MCG tablet TAKE 1 TABLET EVERY  DAY   loratadine (CLARITIN) 10 MG tablet TAKE 1 TABLET EVERY DAY AS NEEDED FOR ITCHING   pravastatin (PRAVACHOL) 40 MG tablet Take 40 mg by mouth daily.    primidone (MYSOLINE) 50 MG tablet Take 50 mg by mouth at bedtime.    primidone (MYSOLINE) 50 MG tablet Take 1 tablet by mouth 2 (two) times daily.   rOPINIRole (REQUIP) 0.5 MG tablet Take 0.5 mg by mouth 2 (two) times daily.   ALPRAZolam (XANAX) 0.5 MG tablet Take 0.5 mg by mouth at bedtime as needed. (Patient not taking: Reported on 08/06/2021)   benzonatate (TESSALON PERLES) 100 MG capsule Take 1 capsule (100 mg total) by mouth 3 (three) times daily as needed for cough. (Patient not taking: Reported on 08/06/2021)   busPIRone (BUSPAR) 30 MG tablet Take 30 mg by mouth 2 (two) times daily. (Patient not taking: Reported on 08/06/2021)   Cholecalciferol (VITAMIN D3) 1000 UNITS CAPS Take 1,000 Units by mouth daily.  (Patient not taking: Reported on 08/06/2021)   doxycycline (VIBRA-TABS) 100 MG tablet Take 1 tablet (100 mg total) by mouth 2 (two) times daily. (Patient not taking: Reported on 08/06/2021)   fluticasone (FLONASE) 50 MCG/ACT nasal spray Place 2 sprays into both nostrils daily. (Patient not taking: Reported on 08/06/2021)   LORazepam (ATIVAN) 0.5 MG tablet Take 0.5 mg by mouth as needed. (Patient not taking: Reported on 08/06/2021)   mirtazapine (REMERON) 30 MG tablet Take 30 mg by mouth at bedtime. (Patient not taking: Reported  on 08/06/2021)   PARoxetine (PAXIL) 30 MG tablet Take 30 mg by mouth daily. (Patient not taking: Reported on 08/06/2021)   predniSONE (DELTASONE) 20 MG tablet Take 1 tablet (20 mg total) by mouth 2 (two) times daily with a meal. (Patient not taking: Reported on 08/06/2021)   propranolol (INDERAL) 20 MG tablet Take 20 mg by mouth 2 (two) times daily. (Patient not taking: Reported on 08/06/2021)   QUEtiapine (SEROQUEL) 50 MG tablet  (Patient not taking: Reported on 08/06/2021)   risperiDONE (RISPERDAL) 1 MG tablet Take 1 mg by mouth  daily. (Patient not taking: Reported on 08/06/2021)   temazepam (RESTORIL) 15 MG capsule Take 15 mg by mouth at bedtime as needed. (Patient not taking: Reported on 08/06/2021)   traZODone (DESYREL) 50 MG tablet Take 1 tablet (50 mg total) by mouth at bedtime as needed for sleep. (Patient not taking: Reported on 08/06/2021)   No facility-administered encounter medications on file as of 08/06/2021.    Allergies (verified) Ace inhibitors   History: Past Medical History:  Diagnosis Date   Hyperlipidemia    Hypertension    Past Surgical History:  Procedure Laterality Date   SHOULDER SURGERY     Family History  Problem Relation Age of Onset   Arthritis Mother    Hypertension Mother    Alzheimer's disease Mother    Social History   Socioeconomic History   Marital status: Married    Spouse name: Not on file   Number of children: 2   Years of education: Not on file   Highest education level: Some college, no degree  Occupational History   Occupation: retired  Tobacco Use   Smoking status: Former    Years: 20.00    Types: Cigarettes    Quit date: 01/05/1990    Years since quitting: 31.6   Smokeless tobacco: Never  Vaping Use   Vaping Use: Never used  Substance and Sexual Activity   Alcohol use: No   Drug use: No   Sexual activity: Not on file  Other Topics Concern   Not on file  Social History Narrative   Not on file   Social Determinants of Health   Financial Resource Strain: Low Risk  (10/16/2019)   Overall Financial Resource Strain (CARDIA)    Difficulty of Paying Living Expenses: Not hard at all  Food Insecurity: No Food Insecurity (10/16/2019)   Hunger Vital Sign    Worried About Running Out of Food in the Last Year: Never true    [place] in the Last Year: Never true  Transportation Needs: No Transportation Needs (10/16/2019)   PRAPARE - Hydrologist (Medical): No    Lack of Transportation (Non-Medical): No  Physical Activity:  Insufficiently Active (08/06/2021)   Exercise Vital Sign    Days of Exercise per Week: 3 days    Minutes of Exercise per Session: 30 min  Stress: No Stress Concern Present (08/06/2021)   [place]    Feeling of Stress : Not at all  Social Connections: Moderately Isolated (10/16/2019)   Social Connection and Isolation Panel [NHANES]    Frequency of Communication with Friends and Family: More than three times a week    Frequency of Social Gatherings with Friends and Family: Twice a week    Attends Religious Services: Never    Marine scientist or Organizations: No    Attends Archivist Meetings: Never  Marital Status: Married    Tobacco Counseling Counseling given: Not Answered   Clinical Intake:  Pre-visit preparation completed: Yes  Pain : No/denies pain     Nutritional Risks: None Diabetes: No  How often do you need to have someone help you when you read instructions, pamphlets, or other written materials from your doctor or pharmacy?: 1 - Never  Diabetic?no  Interpreter Needed?: No  Information entered by :: Kirke Shaggy, LPN   Activities of Daily Living    10/21/2020    8:54 AM  In your present state of health, do you have any difficulty performing the following activities:  Hearing? 0  Vision? 0  Difficulty concentrating or making decisions? 0  Walking or climbing stairs? 0  Dressing or bathing? 0  Doing errands, shopping? 0    Patient Care Team: Jerrol Banana., MD as PCP - General (Family Medicine) Jannifer Franklin, NP as Nurse Practitioner (Neurology) Corey Skains, MD as Consulting Physician (Cardiology) [facility], [place] [facility])  Indicate any recent Medical Services you may have received from other than [facility] providers in the past year (date may be approximate).     Assessment:   This is a routine wellness examination for William Khan.  Hearing/Vision  screen No results found.  Dietary issues and exercise activities discussed:     Goals Addressed             This Visit's Progress    DIET - EAT MORE FRUITS AND VEGETABLES         Depression Screen    08/06/2021    2:59 PM 03/14/2021    4:11 PM 10/21/2020    8:54 AM 10/16/2019    8:45 AM 09/22/2018    1:37 PM 09/20/2017    1:34 PM 09/16/2016    1:44 PM  PHQ 2/9 Scores  PHQ - 2 Score 0 0 0 0 0 0 0  PHQ- 9 Score 0 '2 1   1     '$ Fall Risk    10/21/2020    8:54 AM 10/16/2019    8:47 AM 10/13/2018    3:30 PM 09/22/2018    1:37 PM 09/20/2017    1:34 PM  Fall Risk   Falls in the past year? 0 1 0 1 Yes  Number falls in past yr: 0 1 0 0 1  Injury with Fall? 0 0 0 0 No  Risk for fall due to :  Impaired balance/gait     Follow up  Falls prevention discussed Falls evaluation completed Falls prevention discussed Falls prevention discussed    FALL RISK PREVENTION PERTAINING TO THE HOME:  Any stairs in or around the home? Yes  If so, are there any without handrails? No  Home free of loose throw rugs in walkways, pet beds, electrical cords, etc? Yes  Adequate lighting in your home to reduce risk of falls? Yes   ASSISTIVE DEVICES UTILIZED TO PREVENT FALLS:  Life alert? No  Use of a cane, walker or w/c? No  Grab bars in the bathroom? No  Shower chair or bench in shower? Yes  Elevated toilet seat or a handicapped toilet? No   Cognitive Function:declined 2023        09/16/2016    1:48 PM  6CIT Screen  What Year? 0 points  What month? 0 points  What time? 0 points  Count back from 20 0 points  Months in reverse 0 points  Repeat phrase 6 points  Total Score 6  points    Immunizations Immunization History  Administered Date(s) Administered   Fluad Quad(high Dose 65+) 10/13/2018   Influenza, High Dose Seasonal PF 11/14/2015, 10/22/2016, 10/11/2019   Influenza-Unspecified 10/05/2012, 09/15/2017, 10/05/2020   PFIZER(Purple Top)SARS-COV-2 Vaccination 01/11/2019, 02/04/2019    Pneumococcal Conjugate-13 12/04/2013   Pneumococcal Polysaccharide-23 10/14/2006   Td 03/10/2007, 10/21/2020   Zoster Recombinat (Shingrix) 08/04/2018, 11/09/2018    TDAP status: Up to date  Flu Vaccine status: Up to date  Pneumococcal vaccine status: Up to date  Covid-19 vaccine status: Completed vaccines  Qualifies for Shingles Vaccine? Yes   Zostavax completed No   Shingrix Completed?: Yes  Screening Tests Health Maintenance  Topic Date Due   COVID-19 Vaccine (3 - Pfizer risk series) 03/04/2019   INFLUENZA VACCINE  08/05/2021   TETANUS/TDAP  10/22/2030   Pneumonia Vaccine 29+ Years old  Completed   Zoster Vaccines- Shingrix  Completed   HPV VACCINES  Aged Out    Health Maintenance  Health Maintenance Due  Topic Date Due   COVID-19 Vaccine (3 - Pfizer risk series) 03/04/2019   INFLUENZA VACCINE  08/05/2021    Colorectal cancer screening: No longer required.   Lung Cancer Screening: (Low Dose CT Chest recommended if Age 74-80 years, 30 pack-year currently smoking OR have quit w/in 15years.) does not qualify.    Additional Screening:  Hepatitis C Screening: does not qualify; Completed NO  Vision Screening: Recommended annual ophthalmology exams for early detection of glaucoma and other disorders of the eye. Is the patient up to date with their annual eye exam?  No  Who is the provider or what is the name of the office in which the patient attends annual eye exams? NO ONE If pt is not established with a provider, would they like to be referred to a provider to establish care? No .   Dental Screening: Recommended annual dental exams for proper oral hygiene  Community Resource Referral / Chronic Care Management: CRR required this visit?  No   CCM required this visit?  No      Plan:     I have personally reviewed and noted the following in the patient's chart:   Medical and social history Use of alcohol, tobacco or illicit drugs  Current medications  and supplements including opioid prescriptions. Patient is not currently taking opioid prescriptions. Functional ability and status Nutritional status Physical activity Advanced directives List of other physicians Hospitalizations, surgeries, and ER visits in previous 12 months Vitals Screenings to include cognitive, depression, and falls Referrals and appointments  In addition, I have reviewed and discussed with patient certain preventive protocols, quality metrics, and best practice recommendations. A written personalized care plan for preventive services as well as general preventive health recommendations were provided to patient.     William David, LPN   06/11/8936   Nurse Notes: Marlynn Perking

## 2021-08-16 ENCOUNTER — Other Ambulatory Visit: Payer: Self-pay | Admitting: Family Medicine

## 2021-08-16 DIAGNOSIS — E039 Hypothyroidism, unspecified: Secondary | ICD-10-CM

## 2021-08-18 NOTE — Telephone Encounter (Signed)
Requested Prescriptions  Pending Prescriptions Disp Refills  . levothyroxine (SYNTHROID) 50 MCG tablet [Pharmacy Med Name: LEVOTHYROXINE SODIUM 50 MCG Tablet] 90 tablet 1    Sig: TAKE 1 TABLET EVERY DAY     Endocrinology:  Hypothyroid Agents Passed - 08/16/2021  3:54 AM      Passed - TSH in normal range and within 360 days    TSH  Date Value Ref Range Status  03/17/2021 2.560 0.450 - 4.500 uIU/mL Final         Passed - Valid encounter within last 12 months    Recent Outpatient Visits          3 months ago Cough, unspecified type   [facility], [place], PA-C   4 months ago Allergy, sequela   [employer], [place], PA-C   5 months ago [place], [place], PA-C   10 months ago Annual physical exam   [facility] Jerrol Banana., MD   1 year ago Annual physical exam   [facility] [place] Jerrol Banana., MD

## 2021-11-26 ENCOUNTER — Ambulatory Visit: Payer: Self-pay

## 2021-11-26 ENCOUNTER — Ambulatory Visit: Payer: Medicare PPO | Admitting: Physician Assistant

## 2021-11-26 NOTE — Telephone Encounter (Signed)
  Chief Complaint: cough, wheezing Symptoms: dark yellow phlegm,  Frequency: 3 days Pertinent Negatives: Patient denies SOB, fever Disposition: '[]'$ ED /'[]'$ Urgent Care (no appt availability in office) / '[x]'$ Appointment(In office/virtual)/ '[]'$  [place] [facility]/ '[]'$ Home Care/ '[]'$ Refused Recommended Disposition /'[]'$ [place] [facility]/ '[]'$  Follow-up with PCP Additional Notes: pt will be seen in office < 4 hours- pt wants to establish with Dr. Caryn Section Reason for Disposition  Wheezing is present  Answer Assessment - Initial Assessment Questions 1. ONSET: "When did the cough begin?"      3 days 2. SEVERITY: "How bad is the cough today?"      Frequent cough 3. SPUTUM: "Describe the color of your sputum" (none, dry cough; clear, white, yellow, green)     Dark yellow 4. HEMOPTYSIS: "Are you coughing up any blood?" If so ask: "How much?" (flecks, streaks, tablespoons, etc.)     no 5. DIFFICULTY BREATHING: "Are you having difficulty breathing?" If Yes, ask: "How bad is it?" (e.g., mild, moderate, severe)    - MILD: No SOB at rest, mild SOB with walking, speaks normally in sentences, can lie down, no retractions, pulse < 100.    - MODERATE: SOB at rest, SOB with minimal exertion and prefers to sit, cannot lie down flat, speaks in phrases, mild retractions, audible wheezing, pulse 100-120.    - SEVERE: Very SOB at rest, speaks in single words, struggling to breathe, sitting hunched forward, retractions, pulse > 120      moderate 6. FEVER: "Do you have a fever?" If Yes, ask: "What is your temperature, how was it measured, and when did it start?"     no 7. CARDIAC HISTORY: "Do you have any history of heart disease?" (e.g., heart attack, congestive heart failure)      *No Answer* 8. LUNG HISTORY: "Do you have any history of lung disease?"  (e.g., pulmonary embolus, asthma, emphysema)     *No Answer* 9. PE RISK FACTORS: "Do you have a history of blood clots?" (or: recent major surgery, recent prolonged  travel, bedridden)     *No Answer* 10. OTHER SYMPTOMS: "Do you have any other symptoms?" (e.g., runny nose, wheezing, chest pain)       Wheezing  11. PREGNANCY: "Is there any chance you are pregnant?" "When was your last menstrual period?"       *No Answer* 12. TRAVEL: "Have you traveled out of the country in the last month?" (e.g., travel history, exposures)       *No Answer*  Protocols used: Cough - Acute Productive-A-AH

## 2021-12-05 ENCOUNTER — Encounter: Payer: Self-pay | Admitting: Physician Assistant

## 2021-12-05 ENCOUNTER — Ambulatory Visit (INDEPENDENT_AMBULATORY_CARE_PROVIDER_SITE_OTHER): Payer: Medicare PPO | Admitting: Physician Assistant

## 2021-12-05 VITALS — BP 94/53 | HR 93 | Wt 182.6 lb

## 2021-12-05 DIAGNOSIS — R059 Cough, unspecified: Secondary | ICD-10-CM

## 2021-12-05 DIAGNOSIS — R062 Wheezing: Secondary | ICD-10-CM | POA: Diagnosis not present

## 2021-12-05 DIAGNOSIS — Z0000 Encounter for general adult medical examination without abnormal findings: Secondary | ICD-10-CM

## 2021-12-05 MED ORDER — BENZONATATE 100 MG PO CAPS: 100.0000 mg | ORAL_CAPSULE | Freq: Three times a day (TID) | ORAL | 0 refills | Status: DC | PRN

## 2021-12-05 MED ORDER — DOXYCYCLINE HYCLATE 100 MG PO TABS: 100.0000 mg | ORAL_TABLET | Freq: Two times a day (BID) | ORAL | 0 refills | Status: DC

## 2021-12-05 MED ORDER — PREDNISONE 20 MG PO TABS: 20.0000 mg | ORAL_TABLET | Freq: Two times a day (BID) | ORAL | 0 refills | Status: DC

## 2021-12-05 NOTE — Progress Notes (Unsigned)
I,Sha'taria Tyson,acting as a Education administrator for [employer], PA-C.,have documented all relevant documentation on the behalf of [name], PA-C,as directed by  [employer], PA-C while in the presence of [employer], PA-C.   Complete physical exam   Patient: William Khan   DOB: 1940-12-07   81 y.o. Male  MRN: [id number] Visit Date: 12/05/2021  Today's healthcare provider: [name], PA-C   No chief complaint on file.  Subjective    William Khan is a 81 y.o. male who presents today for a complete physical exam.  He reports consuming a general diet.  The patient reports going walking 3 times a week for at least 15 to 20 minutes.  He generally feels well. He reports sleeping well. He does have additional problems to discuss today.  HPI  Patient reports runny nose, cough with greenish/yellow mucus for 2 weeks. Patient has been taking mucinex and symptoms have been unchanged.   Past Medical History:  Diagnosis Date   Hyperlipidemia    Hypertension    Past Surgical History:  Procedure Laterality Date   SHOULDER SURGERY     Social History   Socioeconomic History   Marital status: Married    Spouse name: Not on file   Number of children: 2   Years of education: Not on file   Highest education level: Some college, no degree  Occupational History   Occupation: retired  Tobacco Use   Smoking status: Former    Years: 20.00    Types: Cigarettes    Quit date: 01/05/1990    Years since quitting: 31.9   Smokeless tobacco: Never  Vaping Use   Vaping Use: Never used  Substance and Sexual Activity   Alcohol use: No   Drug use: No   Sexual activity: Not on file  Other Topics Concern   Not on file  Social History Narrative   Not on file   Social Determinants of Health   Financial Resource Strain: Low Risk  (08/06/2021)   Overall Financial Resource Strain (CARDIA)    Difficulty of Paying Living Expenses: Not hard at all  Food Insecurity: No Food Insecurity (08/06/2021)    Hunger Vital Sign    Worried About Running Out of Food in the Last Year: Never true    [place] in the Last Year: Never true  Transportation Needs: No Transportation Needs (08/06/2021)   PRAPARE - Hydrologist (Medical): No    Lack of Transportation (Non-Medical): No  Physical Activity: Insufficiently Active (08/06/2021)   Exercise Vital Sign    Days of Exercise per Week: 3 days    Minutes of Exercise per Session: 30 min  Stress: No Stress Concern Present (08/06/2021)   [place]    Feeling of Stress : Not at all  Social Connections: Moderately Isolated (08/06/2021)   Social Connection and Isolation Panel [NHANES]    Frequency of Communication with Friends and Family: Twice a week    Frequency of Social Gatherings with Friends and Family: Once a week    Attends Religious Services: Never    Marine scientist or Organizations: No    Attends Archivist Meetings: Never    Marital Status: Married  Human resources officer Violence: Not At Risk (08/06/2021)   Humiliation, Afraid, Rape, and Kick questionnaire    Fear of Current or Ex-Partner: No    Emotionally Abused: No    Physically Abused:  No    Sexually Abused: No   Family Status  Relation Name Status   Mother  Deceased   Father  Deceased at age 13       due to heart problems   Sister  Alive   Brother  Deceased at age 2 months       due to Pertussis and Measles   Sister  22   Family History  Problem Relation Age of Onset   Arthritis Mother    Hypertension Mother    Alzheimer's disease Mother    Allergies  Allergen Reactions   Ace Inhibitors Swelling and Other (See Comments)    Patient Care Team: Jerrol Banana., MD as PCP - General (Family Medicine) Jannifer Franklin, NP as Nurse Practitioner (Neurology) Corey Skains, MD as Consulting Physician (Cardiology) Pa, [place] (Optometry)    Medications: Outpatient Medications Prior to Visit  Medication Sig   ALPRAZolam (XANAX) 0.5 MG tablet Take 0.5 mg by mouth at bedtime as needed. (Patient not taking: Reported on 08/06/2021)   benzonatate (TESSALON PERLES) 100 MG capsule Take 1 capsule (100 mg total) by mouth 3 (three) times daily as needed for cough. (Patient not taking: Reported on 08/06/2021)   betamethasone dipropionate (DIPROLENE) 0.05 % ointment Apply to affected area BID until clear   betamethasone dipropionate (DIPROLENE) 0.05 % ointment Apply topically.   busPIRone (BUSPAR) 30 MG tablet Take 30 mg by mouth 2 (two) times daily. (Patient not taking: Reported on 08/06/2021)   Cholecalciferol (VITAMIN D3) 1000 UNITS CAPS Take 1,000 Units by mouth daily.  (Patient not taking: Reported on 08/06/2021)   doxycycline (VIBRA-TABS) 100 MG tablet Take 1 tablet (100 mg total) by mouth 2 (two) times daily. (Patient not taking: Reported on 08/06/2021)   DULoxetine (CYMBALTA) 30 MG capsule Take 30 mg by mouth daily.    fluticasone (FLONASE) 50 MCG/ACT nasal spray Place 2 sprays into both nostrils daily. (Patient not taking: Reported on 08/06/2021)   levothyroxine (SYNTHROID) 50 MCG tablet TAKE 1 TABLET EVERY DAY   loratadine (CLARITIN) 10 MG tablet TAKE 1 TABLET EVERY DAY AS NEEDED FOR ITCHING   LORazepam (ATIVAN) 0.5 MG tablet Take 0.5 mg by mouth as needed. (Patient not taking: Reported on 08/06/2021)   mirtazapine (REMERON) 30 MG tablet Take 30 mg by mouth at bedtime. (Patient not taking: Reported on 08/06/2021)   PARoxetine (PAXIL) 30 MG tablet Take 30 mg by mouth daily. (Patient not taking: Reported on 08/06/2021)   pravastatin (PRAVACHOL) 40 MG tablet Take 40 mg by mouth daily.    predniSONE (DELTASONE) 20 MG tablet Take 1 tablet (20 mg total) by mouth 2 (two) times daily with a meal. (Patient not taking: Reported on 08/06/2021)   primidone (MYSOLINE) 50 MG tablet Take 50 mg by mouth at bedtime.    primidone (MYSOLINE) 50 MG tablet Take 1 tablet  by mouth 2 (two) times daily.   propranolol (INDERAL) 20 MG tablet Take 20 mg by mouth 2 (two) times daily. (Patient not taking: Reported on 08/06/2021)   QUEtiapine (SEROQUEL) 50 MG tablet  (Patient not taking: Reported on 08/06/2021)   risperiDONE (RISPERDAL) 1 MG tablet Take 1 mg by mouth daily. (Patient not taking: Reported on 08/06/2021)   rOPINIRole (REQUIP) 0.5 MG tablet Take 0.5 mg by mouth 2 (two) times daily.   temazepam (RESTORIL) 15 MG capsule Take 15 mg by mouth at bedtime as needed. (Patient not taking: Reported on 08/06/2021)   traZODone (DESYREL) 50 MG tablet  Take 1 tablet (50 mg total) by mouth at bedtime as needed for sleep. (Patient not taking: Reported on 08/06/2021)   No facility-administered medications prior to visit.    Review of Systems  {Labs  Heme  Chem  Endocrine  Serology  Results Review (optional):23779}  Objective    There were no vitals taken for this visit. {Show previous vital signs (optional):23777}   Physical Exam Vitals reviewed.  Constitutional:      General: He is not in acute distress.    Appearance: Normal appearance. He is well-developed. He is not diaphoretic.  HENT:     Head: Normocephalic and atraumatic.     Right Ear: Tympanic membrane, ear canal and external ear normal.     Left Ear: Tympanic membrane, ear canal and external ear normal.     Nose: Nose normal.     Mouth/Throat:     Mouth: Mucous membranes are moist.     Pharynx: Oropharynx is clear. No oropharyngeal exudate.  Eyes:     General: No scleral icterus.    Conjunctiva/sclera: Conjunctivae normal.     Pupils: Pupils are equal, round, and reactive to light.  Neck:     Thyroid: No thyromegaly.  Cardiovascular:     Rate and Rhythm: Normal rate and regular rhythm.     Pulses: Normal pulses.     Heart sounds: Normal heart sounds. No murmur heard. Pulmonary:     Effort: Pulmonary effort is normal. No respiratory distress.     Breath sounds: Normal breath sounds. No wheezing or  rales.  Abdominal:     General: There is no distension.     Palpations: Abdomen is soft.     Tenderness: There is no abdominal tenderness.  Musculoskeletal:        General: No deformity.     Cervical back: Neck supple.     Right lower leg: No edema.     Left lower leg: No edema.  Lymphadenopathy:     Cervical: No cervical adenopathy.  Skin:    General: Skin is warm and dry.     Findings: No rash.  Neurological:     Mental Status: He is alert and oriented to person, place, and time. Mental status is at baseline.     Sensory: No sensory deficit.     Motor: No weakness.     Gait: Gait normal.  Psychiatric:        Mood and Affect: Mood normal.        Behavior: Behavior normal.        Thought Content: Thought content normal.     ***  Last depression screening scores    08/06/2021    2:59 PM 03/14/2021    4:11 PM 10/21/2020    8:54 AM  PHQ 2/9 Scores  PHQ - 2 Score 0 0 0  PHQ- 9 Score 0 2 1   Last fall risk screening    08/06/2021    3:01 PM  [place] in the past year? 0  Number falls in past yr: 0  Injury with Fall? 0  Risk for fall due to : No Fall Risks  Follow up Falls evaluation completed   Last Audit-C alcohol use screening    08/06/2021    2:58 PM  Alcohol Use Disorder Test (AUDIT)  1. How often do you have a drink containing alcohol? 3  2. How many drinks containing alcohol do you have on a typical day when you are drinking? 0  3. How often do you have six or more drinks on one occasion? 0  AUDIT-C Score 3  4. How often during the last year have you found that you were not able to stop drinking once you had started? 0  5. How often during the last year have you failed to do what was normally expected from you because of drinking? 0  6. How often during the last year have you needed a first drink in the morning to get yourself going after a heavy drinking session? 0  7. How often during the last year have you had a feeling of guilt of remorse after  drinking? 0  8. How often during the last year have you been unable to remember what happened the night before because you had been drinking? 0  9. Have you or someone else been injured as a result of your drinking? 0  10. Has a relative or friend or a doctor or another health worker been concerned about your drinking or suggested you cut down? 0  Alcohol Use Disorder Identification Test Final Score (AUDIT) 3   A score of 3 or more in women, and 4 or more in men indicates increased risk for alcohol abuse, EXCEPT if all of the points are from question 1   No results found for any visits on 12/05/21.  Assessment & Plan    Routine Health Maintenance and Physical Exam  Exercise Activities and Dietary recommendations  Goals      DIET - EAT MORE FRUITS AND VEGETABLES     LIFESTYLE - DECREASE FALLS RISK     Recommend to remove any items from the home that may cause slips or trips.        Immunization History  Administered Date(s) Administered   Fluad Quad(high Dose 65+) 10/13/2018   Influenza, High Dose Seasonal PF 11/14/2015, 10/22/2016, 10/11/2019   Influenza-Unspecified 10/05/2012, 09/15/2017, 10/05/2020   PFIZER(Purple Top)SARS-COV-2 Vaccination 01/11/2019, 02/04/2019   Pneumococcal Conjugate-13 12/04/2013   Pneumococcal Polysaccharide-23 10/14/2006   Td 03/10/2007, 10/21/2020   Zoster Recombinat (Shingrix) 08/04/2018, 11/09/2018    Health Maintenance  Topic Date Due   INFLUENZA VACCINE  08/05/2021   COVID-19 Vaccine (3 - 2023-24 season) 09/05/2021   Medicare Annual Wellness (AWV)  08/07/2022   DTaP/Tdap/Td (3 - Tdap) 10/22/2030   Pneumonia Vaccine 62+ Years old  Completed   Zoster Vaccines- Shingrix  Completed   HPV VACCINES  Aged Out    Discussed health benefits of physical activity, and encouraged him to engage in regular exercise appropriate for his age and condition.  1. Wheezing *** - doxycycline (VIBRA-TABS) 100 MG tablet; Take 1 tablet (100 mg total) by mouth 2  (two) times daily.  Dispense: 14 tablet; Refill: 0 - predniSONE (DELTASONE) 20 MG tablet; Take 1 tablet (20 mg total) by mouth 2 (two) times daily with a meal.  Dispense: 10 tablet; Refill: 0  2. Cough, unspecified type *** - doxycycline (VIBRA-TABS) 100 MG tablet; Take 1 tablet (100 mg total) by mouth 2 (two) times daily.  Dispense: 14 tablet; Refill: 0 - benzonatate (TESSALON PERLES) 100 MG capsule; Take 1 capsule (100 mg total) by mouth 3 (three) times daily as needed for cough.  Dispense: 20 capsule; Refill: 0   No follow-ups on file.     The patient was advised to call back or seek an in-person evaluation if the symptoms worsen or if the condition fails to improve as anticipated.  I discussed the assessment and treatment plan with  the patient. The patient was provided an opportunity to ask questions and all were answered. The patient agreed with the plan and demonstrated an understanding of the instructions.  The entirety of the information documented in the History of Present Illness, Review of Systems and Physical Exam were personally obtained by me. Portions of this information were initially documented by the CMA and reviewed by me for thoroughness and accuracy.  [name], [facility], [place] [phone] (phone) [phone] (fax)

## 2021-12-11 NOTE — Progress Notes (Unsigned)
I,Sulibeya S Dimas,acting as a Education administrator for [employer], PA-C.,have documented all relevant documentation on the behalf of [name], PA-C,as directed by  [employer], PA-C while in the presence of [employer], PA-C.     Established patient visit   Patient: William Khan   DOB: [birth year]   81 y.o. Male  MRN: [id number] Visit Date: 12/12/2021  Today's healthcare provider: [name], PA-C   Chief Complaint  Patient presents with   Follow-up   Subjective    HPI Follow up for wheezing  The patient was last seen for this 1 weeks ago. Changes made at last visit include start Doxy, prednisone and benzonatate.  He reports excellent compliance with treatment. He feels that condition is Improved. Patient reports cough is better. He reports still has a "cold" and not feeling well. He reports feeling tired, congestion, runny nose, sinus pressure and wheezing. Patient denies fever, chills, appetite changes, shortness of breath, headaches, abdominal pain, nausea or vomiting. He is not having side effects.   -----------------------------------------------------------------------------------------   Medications: Outpatient Medications Prior to Visit  Medication Sig   levothyroxine (SYNTHROID) 50 MCG tablet TAKE 1 TABLET EVERY DAY   loratadine (CLARITIN) 10 MG tablet TAKE 1 TABLET EVERY DAY AS NEEDED FOR ITCHING   pravastatin (PRAVACHOL) 40 MG tablet Take 40 mg by mouth daily.    primidone (MYSOLINE) 50 MG tablet Take 1 tablet by mouth at bedtime.   rOPINIRole (REQUIP) 0.5 MG tablet Take 0.5 mg by mouth 2 (two) times daily.   ALPRAZolam (XANAX) 0.5 MG tablet Take 0.5 mg by mouth at bedtime as needed. (Patient not taking: Reported on 12/12/2021)   benzonatate (TESSALON PERLES) 100 MG capsule Take 1 capsule (100 mg total) by mouth 3 (three) times daily as needed for cough. (Patient not taking: Reported on 12/12/2021)   betamethasone dipropionate (DIPROLENE) 0.05 % ointment Apply to  affected area BID until clear (Patient not taking: Reported on 12/12/2021)   betamethasone dipropionate (DIPROLENE) 0.05 % ointment Apply topically. (Patient not taking: Reported on 12/12/2021)   busPIRone (BUSPAR) 30 MG tablet Take 30 mg by mouth 2 (two) times daily. (Patient not taking: Reported on 12/12/2021)   Cholecalciferol (VITAMIN D3) 1000 UNITS CAPS Take 1,000 Units by mouth daily. (Patient not taking: Reported on 12/12/2021)   doxycycline (VIBRA-TABS) 100 MG tablet Take 1 tablet (100 mg total) by mouth 2 (two) times daily. (Patient not taking: Reported on 12/12/2021)   DULoxetine (CYMBALTA) 30 MG capsule Take 30 mg by mouth daily.  (Patient not taking: Reported on 12/12/2021)   fluticasone (FLONASE) 50 MCG/ACT nasal spray Place 2 sprays into both nostrils daily. (Patient not taking: Reported on 12/12/2021)   LORazepam (ATIVAN) 0.5 MG tablet Take 0.5 mg by mouth as needed. (Patient not taking: Reported on 12/12/2021)   mirtazapine (REMERON) 30 MG tablet Take 30 mg by mouth at bedtime. (Patient not taking: Reported on 12/12/2021)   PARoxetine (PAXIL) 30 MG tablet Take 30 mg by mouth daily. (Patient not taking: Reported on 12/12/2021)   predniSONE (DELTASONE) 20 MG tablet Take 1 tablet (20 mg total) by mouth 2 (two) times daily with a meal. (Patient not taking: Reported on 12/12/2021)   propranolol (INDERAL) 20 MG tablet Take 20 mg by mouth 2 (two) times daily. (Patient not taking: Reported on 12/12/2021)   QUEtiapine (SEROQUEL) 50 MG tablet  (Patient not taking: Reported on 12/12/2021)   risperiDONE (RISPERDAL) 1 MG tablet Take 1 mg by mouth daily. (Patient not taking: Reported on  12/12/2021)   temazepam (RESTORIL) 15 MG capsule Take 15 mg by mouth at bedtime as needed. (Patient not taking: Reported on 12/12/2021)   traZODone (DESYREL) 50 MG tablet Take 1 tablet (50 mg total) by mouth at bedtime as needed for sleep. (Patient not taking: Reported on 12/12/2021)   [DISCONTINUED] primidone (MYSOLINE) 50 MG  tablet Take 50 mg by mouth at bedtime.  (Patient not taking: Reported on 12/12/2021)   No facility-administered medications prior to visit.    Review of Systems  Constitutional:  Positive for fatigue. Negative for appetite change, chills and fever.  HENT:  Positive for congestion, rhinorrhea and sinus pressure. Negative for sinus pain, sneezing and sore throat.   Respiratory:  Positive for wheezing. Negative for cough and shortness of breath.   Gastrointestinal:  Negative for abdominal pain, nausea and vomiting.  Neurological:  Negative for headaches.    {Labs  Heme  Chem  Endocrine  Serology  Results Review (optional):23779}   Objective    BP 114/79 (BP Location: Left Arm, Patient Position: Sitting, Cuff Size: Large)   Pulse 81   Temp (!) 97.5 F (36.4 C) (Oral)   Resp 16   Wt 186 lb (84.4 kg)   SpO2 99%   BMI 26.69 kg/m  BP Readings from Last 3 Encounters:  12/12/21 114/79  12/05/21 (!) 94/53  05/02/21 122/61   Wt Readings from Last 3 Encounters:  12/12/21 186 lb (84.4 kg)  12/05/21 182 lb 9.6 oz (82.8 kg)  08/06/21 176 lb (79.8 kg)      Physical Exam Vitals reviewed.  Constitutional:      General: He is in acute distress.     Appearance: Normal appearance. He is not ill-appearing, toxic-appearing or diaphoretic.  HENT:     Head: Normocephalic and atraumatic.     Right Ear: There is impacted cerumen.     Left Ear: Tympanic membrane, ear canal and external ear normal.     Nose: Congestion and rhinorrhea present.     Mouth/Throat:     Pharynx: Posterior oropharyngeal erythema present.  Eyes:     General: No scleral icterus.       Right eye: No discharge.        Left eye: No discharge.     Extraocular Movements: Extraocular movements intact.     Conjunctiva/sclera: Conjunctivae normal.     Pupils: Pupils are equal, round, and reactive to light.  Cardiovascular:     Rate and Rhythm: Normal rate and regular rhythm.     Pulses: Normal pulses.     Heart  sounds: Normal heart sounds. No murmur heard. Pulmonary:     Effort: Pulmonary effort is normal. No respiratory distress.     Breath sounds: Wheezing present. No rhonchi.  Abdominal:     General: Abdomen is flat. Bowel sounds are normal.     Palpations: Abdomen is soft.  Musculoskeletal:        General: Normal range of motion.     Cervical back: Normal range of motion and neck supple.     Right lower leg: No edema.     Left lower leg: No edema.  Lymphadenopathy:     Cervical: No cervical adenopathy.  Skin:    General: Skin is warm and dry.     Findings: No rash.  Neurological:     General: No focal deficit present.     Mental Status: He is alert and oriented to person, place, and time. Mental status is at baseline.  Psychiatric:  Behavior: Behavior normal.        Thought Content: Thought content normal.      No results found for any visits on 12/12/21.  Assessment & Plan     1. Cough, unspecified type *** - albuterol (VENTOLIN HFA) 108 (90 Base) MCG/ACT inhaler; Inhale 2 puffs into the lungs every 6 (six) hours as needed for wheezing or shortness of breath.  Dispense: 8 g; Refill: 2  2. Wheezing *** - DG Chest 2 View; Future - albuterol (VENTOLIN HFA) 108 (90 Base) MCG/ACT inhaler; Inhale 2 puffs into the lungs every 6 (six) hours as needed for wheezing or shortness of breath.  Dispense: 8 g; Refill: 2  3. Nasal congestion *** - fluticasone (FLONASE) 50 MCG/ACT nasal spray; Place 2 sprays into both nostrils daily.  Dispense: 16 g; Refill: 6   No follow-ups on file.      {provider attestation***:1}   [name], Hershal Coria  [facility] [phone] (phone) [phone] (fax)  [place]

## 2021-12-12 ENCOUNTER — Ambulatory Visit
Admission: RE | Admit: 2021-12-12 | Discharge: 2021-12-12 | Disposition: A | Discharge status: Home / Self Care | Payer: Medicare PPO | Attending: Physician Assistant | Admitting: Physician Assistant

## 2021-12-12 ENCOUNTER — Ambulatory Visit (INDEPENDENT_AMBULATORY_CARE_PROVIDER_SITE_OTHER): Payer: Medicare PPO | Admitting: Physician Assistant

## 2021-12-12 ENCOUNTER — Encounter: Payer: Self-pay | Admitting: Physician Assistant

## 2021-12-12 ENCOUNTER — Ambulatory Visit
Admission: RE | Admit: 2021-12-12 | Discharge: 2021-12-12 | Disposition: A | Discharge status: Home / Self Care | Payer: Medicare PPO | Source: Ambulatory Visit | Attending: Physician Assistant | Admitting: Physician Assistant

## 2021-12-12 VITALS — BP 114/79 | HR 81 | Temp 97.5°F | Resp 16 | Wt 186.0 lb

## 2021-12-12 DIAGNOSIS — R062 Wheezing: Secondary | ICD-10-CM

## 2021-12-12 DIAGNOSIS — R0981 Nasal congestion: Secondary | ICD-10-CM

## 2021-12-12 DIAGNOSIS — R059 Cough, unspecified: Secondary | ICD-10-CM

## 2021-12-12 MED ORDER — FLUTICASONE PROPIONATE 50 MCG/ACT NA SUSP: 2.0000 | Freq: Every day | NASAL | 6 refills | Status: DC

## 2021-12-12 MED ORDER — ALBUTEROL SULFATE HFA 108 (90 BASE) MCG/ACT IN AERS: 2.0000 | INHALATION_SPRAY | Freq: Four times a day (QID) | RESPIRATORY_TRACT | 2 refills | Status: AC | PRN

## 2021-12-15 NOTE — Progress Notes (Signed)
Please, let pt know that his CXR showed no active cardiopulmonary disease. His lungs are clear. Please, continue with symptomatic treatment including intranasal flonase and albuterol inhaler? If his symptoms worsen, please, ask him RTC

## 2022-01-07 DIAGNOSIS — R42 Dizziness and giddiness: Secondary | ICD-10-CM | POA: Diagnosis not present

## 2022-01-07 DIAGNOSIS — R002 Palpitations: Secondary | ICD-10-CM | POA: Diagnosis not present

## 2022-01-07 DIAGNOSIS — I493 Ventricular premature depolarization: Secondary | ICD-10-CM | POA: Diagnosis not present

## 2022-01-15 DIAGNOSIS — R58 Hemorrhage, not elsewhere classified: Secondary | ICD-10-CM | POA: Diagnosis not present

## 2022-01-15 DIAGNOSIS — L570 Actinic keratosis: Secondary | ICD-10-CM | POA: Diagnosis not present

## 2022-01-15 DIAGNOSIS — X32XXXA Exposure to sunlight, initial encounter: Secondary | ICD-10-CM | POA: Diagnosis not present

## 2022-01-15 DIAGNOSIS — D485 Neoplasm of uncertain behavior of skin: Secondary | ICD-10-CM | POA: Diagnosis not present

## 2022-01-15 DIAGNOSIS — D225 Melanocytic nevi of trunk: Secondary | ICD-10-CM | POA: Diagnosis not present

## 2022-02-02 DIAGNOSIS — R001 Bradycardia, unspecified: Secondary | ICD-10-CM | POA: Diagnosis not present

## 2022-02-06 ENCOUNTER — Other Ambulatory Visit
Admission: RE | Admit: 2022-02-06 | Discharge: 2022-02-06 | Disposition: A | Discharge status: Home / Self Care | Payer: Medicare PPO | Source: Ambulatory Visit | Attending: Student | Admitting: Student

## 2022-02-06 DIAGNOSIS — I059 Rheumatic mitral valve disease, unspecified: Secondary | ICD-10-CM | POA: Diagnosis not present

## 2022-02-06 DIAGNOSIS — I2089 Other forms of angina pectoris: Secondary | ICD-10-CM | POA: Diagnosis not present

## 2022-02-06 DIAGNOSIS — R0602 Shortness of breath: Secondary | ICD-10-CM | POA: Diagnosis not present

## 2022-02-06 DIAGNOSIS — I493 Ventricular premature depolarization: Secondary | ICD-10-CM | POA: Diagnosis not present

## 2022-02-06 DIAGNOSIS — R079 Chest pain, unspecified: Secondary | ICD-10-CM | POA: Diagnosis not present

## 2022-02-06 DIAGNOSIS — R001 Bradycardia, unspecified: Secondary | ICD-10-CM | POA: Diagnosis not present

## 2022-02-06 DIAGNOSIS — I10 Essential (primary) hypertension: Secondary | ICD-10-CM | POA: Diagnosis not present

## 2022-02-06 DIAGNOSIS — E782 Mixed hyperlipidemia: Secondary | ICD-10-CM | POA: Diagnosis not present

## 2022-02-06 LAB — TROPONIN I (HIGH SENSITIVITY): Troponin I (High Sensitivity): 38 ng/L — ABNORMAL HIGH (ref <18)

## 2022-02-11 DIAGNOSIS — R0602 Shortness of breath: Secondary | ICD-10-CM | POA: Diagnosis not present

## 2022-02-11 DIAGNOSIS — I2089 Other forms of angina pectoris: Secondary | ICD-10-CM | POA: Diagnosis not present

## 2022-02-13 DIAGNOSIS — E039 Hypothyroidism, unspecified: Secondary | ICD-10-CM | POA: Diagnosis not present

## 2022-02-13 DIAGNOSIS — I951 Orthostatic hypotension: Secondary | ICD-10-CM | POA: Diagnosis not present

## 2022-02-13 DIAGNOSIS — M1990 Unspecified osteoarthritis, unspecified site: Secondary | ICD-10-CM | POA: Diagnosis not present

## 2022-02-13 DIAGNOSIS — I739 Peripheral vascular disease, unspecified: Secondary | ICD-10-CM | POA: Diagnosis not present

## 2022-02-13 DIAGNOSIS — F411 Generalized anxiety disorder: Secondary | ICD-10-CM | POA: Diagnosis not present

## 2022-02-13 DIAGNOSIS — J42 Unspecified chronic bronchitis: Secondary | ICD-10-CM | POA: Diagnosis not present

## 2022-02-13 DIAGNOSIS — F325 Major depressive disorder, single episode, in full remission: Secondary | ICD-10-CM | POA: Diagnosis not present

## 2022-02-13 DIAGNOSIS — I10 Essential (primary) hypertension: Secondary | ICD-10-CM | POA: Diagnosis not present

## 2022-02-13 DIAGNOSIS — G250 Essential tremor: Secondary | ICD-10-CM | POA: Diagnosis not present

## 2022-02-20 DIAGNOSIS — E782 Mixed hyperlipidemia: Secondary | ICD-10-CM | POA: Diagnosis not present

## 2022-02-20 DIAGNOSIS — I058 Other rheumatic mitral valve diseases: Secondary | ICD-10-CM | POA: Diagnosis not present

## 2022-02-20 DIAGNOSIS — I493 Ventricular premature depolarization: Secondary | ICD-10-CM | POA: Diagnosis not present

## 2022-02-20 DIAGNOSIS — I10 Essential (primary) hypertension: Secondary | ICD-10-CM | POA: Diagnosis not present

## 2022-03-25 DIAGNOSIS — N401 Enlarged prostate with lower urinary tract symptoms: Secondary | ICD-10-CM | POA: Diagnosis not present

## 2022-03-25 DIAGNOSIS — I10 Essential (primary) hypertension: Secondary | ICD-10-CM | POA: Diagnosis not present

## 2022-03-25 DIAGNOSIS — R739 Hyperglycemia, unspecified: Secondary | ICD-10-CM | POA: Diagnosis not present

## 2022-03-25 DIAGNOSIS — F411 Generalized anxiety disorder: Secondary | ICD-10-CM | POA: Diagnosis not present

## 2022-03-25 DIAGNOSIS — R3915 Urgency of urination: Secondary | ICD-10-CM | POA: Diagnosis not present

## 2022-03-25 DIAGNOSIS — E782 Mixed hyperlipidemia: Secondary | ICD-10-CM | POA: Diagnosis not present

## 2022-03-25 DIAGNOSIS — G3184 Mild cognitive impairment, so stated: Secondary | ICD-10-CM | POA: Diagnosis not present

## 2022-05-05 DIAGNOSIS — G8929 Other chronic pain: Secondary | ICD-10-CM | POA: Diagnosis not present

## 2022-05-05 DIAGNOSIS — M1711 Unilateral primary osteoarthritis, right knee: Secondary | ICD-10-CM | POA: Diagnosis not present

## 2022-05-19 ENCOUNTER — Other Ambulatory Visit: Payer: Self-pay | Admitting: Physician Assistant

## 2022-05-19 DIAGNOSIS — R0981 Nasal congestion: Secondary | ICD-10-CM

## 2022-05-19 NOTE — Telephone Encounter (Signed)
Requested Prescriptions  Pending Prescriptions Disp Refills   fluticasone (FLONASE) 50 MCG/ACT nasal spray [Pharmacy Med Name: FLUTICASONE PROPIONATE 50 MCG/ACT Suspension] 16 g 2    Sig: USE 2 SPRAYS IN EACH NOSTRIL EVERY DAY     Ear, Nose, and Throat: Nasal Preparations - Corticosteroids Passed - 05/19/2022  3:40 PM      Passed - Valid encounter within last 12 months    Recent Outpatient Visits           5 months ago Cough, unspecified type   [facility] [place], [place], PA-C   5 months ago Annual physical exam   [facility] [place], [place], PA-C   1 year ago Cough, unspecified type   [facility] [place], [employer], PA-C   1 year ago Allergy, sequela   [facility] [place], [place], PA-C   1 year ago Pre-diabetes   [facility] [place], [place], [place]

## 2022-05-20 DIAGNOSIS — R251 Tremor, unspecified: Secondary | ICD-10-CM | POA: Diagnosis not present

## 2022-05-20 DIAGNOSIS — G479 Sleep disorder, unspecified: Secondary | ICD-10-CM | POA: Diagnosis not present

## 2022-05-20 DIAGNOSIS — F411 Generalized anxiety disorder: Secondary | ICD-10-CM | POA: Diagnosis not present

## 2022-05-22 ENCOUNTER — Other Ambulatory Visit: Payer: Self-pay | Admitting: Family Medicine

## 2022-05-22 DIAGNOSIS — J309 Allergic rhinitis, unspecified: Secondary | ICD-10-CM

## 2022-05-22 NOTE — Telephone Encounter (Signed)
Requested Prescriptions  Refused Prescriptions Disp Refills   loratadine (CLARITIN) 10 MG tablet 90 tablet 1     Ear, Nose, and Throat:  Antihistamines 2 Failed - 05/22/2022 10:00 AM      Failed - Cr in normal range and within 360 days    Creat  Date Value Ref Range Status  09/16/2016 1.08 0.70 - 1.18 mg/dL Final    Comment:    For patients >84 years of age, the reference limit for Creatinine is approximately 13% higher for people identified as African-American. .    Creatinine, Ser  Date Value Ref Range Status  03/17/2021 0.97 0.76 - 1.27 mg/dL Final         Passed - Valid encounter within last 12 months    Recent Outpatient Visits           5 months ago Cough, unspecified type   [facility] [place], [place], PA-C   5 months ago Annual physical exam   [place] [facility] [place], [place], PA-C   1 year ago Cough, unspecified type   [facility] [place], [place], PA-C   1 year ago Allergy, sequela   [place] [facility], [place], PA-C   1 year ago Pre-diabetes   [facility] [place], [place], [place]

## 2022-05-22 NOTE — Telephone Encounter (Signed)
Medication Refill - Medication: loratadine (CLARITIN) 10 MG tablet  Has the patient contacted their pharmacy? Yes.   Megan from pharamacy called to request the refill.  Preferred Pharmacy (with phone number or street name):  [facility] - [place], [place] - [address] Phone: [phone]  Fax: [phone]     Has the patient been seen for an appointment in the last year OR does the patient have an upcoming appointment? No.  Agent: Please be advised that RX refills may take up to 3 business days. We ask that you follow-up with your pharmacy.

## 2022-05-25 DIAGNOSIS — Z85828 Personal history of other malignant neoplasm of skin: Secondary | ICD-10-CM | POA: Diagnosis not present

## 2022-05-25 DIAGNOSIS — D2262 Melanocytic nevi of left upper limb, including shoulder: Secondary | ICD-10-CM | POA: Diagnosis not present

## 2022-05-25 DIAGNOSIS — Z08 Encounter for follow-up examination after completed treatment for malignant neoplasm: Secondary | ICD-10-CM | POA: Diagnosis not present

## 2022-05-25 DIAGNOSIS — L821 Other seborrheic keratosis: Secondary | ICD-10-CM | POA: Diagnosis not present

## 2022-05-25 DIAGNOSIS — D2261 Melanocytic nevi of right upper limb, including shoulder: Secondary | ICD-10-CM | POA: Diagnosis not present

## 2022-05-25 DIAGNOSIS — D2272 Melanocytic nevi of left lower limb, including hip: Secondary | ICD-10-CM | POA: Diagnosis not present

## 2022-05-25 DIAGNOSIS — D225 Melanocytic nevi of trunk: Secondary | ICD-10-CM | POA: Diagnosis not present

## 2022-05-25 DIAGNOSIS — L80 Vitiligo: Secondary | ICD-10-CM | POA: Diagnosis not present

## 2022-05-25 DIAGNOSIS — D2271 Melanocytic nevi of right lower limb, including hip: Secondary | ICD-10-CM | POA: Diagnosis not present

## 2022-06-05 DIAGNOSIS — B356 Tinea cruris: Secondary | ICD-10-CM | POA: Diagnosis not present

## 2022-06-17 DIAGNOSIS — K279 Peptic ulcer, site unspecified, unspecified as acute or chronic, without hemorrhage or perforation: Secondary | ICD-10-CM | POA: Diagnosis not present

## 2022-06-17 DIAGNOSIS — I10 Essential (primary) hypertension: Secondary | ICD-10-CM | POA: Diagnosis not present

## 2022-06-17 DIAGNOSIS — E119 Type 2 diabetes mellitus without complications: Secondary | ICD-10-CM | POA: Diagnosis not present

## 2023-02-14 ENCOUNTER — Other Ambulatory Visit: Payer: Self-pay

## 2023-02-14 ENCOUNTER — Emergency Department (HOSPITAL_COMMUNITY)
Admission: EM | Admit: 2023-02-14 | Discharge: 2023-02-14 | Disposition: A | Discharge status: Home / Self Care | Payer: Medicare PPO | Attending: Emergency Medicine | Admitting: Emergency Medicine

## 2023-02-14 ENCOUNTER — Emergency Department (HOSPITAL_COMMUNITY): Payer: Medicare PPO

## 2023-02-14 ENCOUNTER — Encounter (HOSPITAL_COMMUNITY): Payer: Self-pay

## 2023-02-14 DIAGNOSIS — R531 Weakness: Secondary | ICD-10-CM | POA: Diagnosis present

## 2023-02-14 DIAGNOSIS — Z7984 Long term (current) use of oral hypoglycemic drugs: Secondary | ICD-10-CM | POA: Diagnosis not present

## 2023-02-14 DIAGNOSIS — I672 Cerebral atherosclerosis: Secondary | ICD-10-CM | POA: Insufficient documentation

## 2023-02-14 DIAGNOSIS — R262 Difficulty in walking, not elsewhere classified: Secondary | ICD-10-CM | POA: Diagnosis not present

## 2023-02-14 DIAGNOSIS — E1165 Type 2 diabetes mellitus with hyperglycemia: Secondary | ICD-10-CM | POA: Diagnosis not present

## 2023-02-14 DIAGNOSIS — I10 Essential (primary) hypertension: Secondary | ICD-10-CM | POA: Insufficient documentation

## 2023-02-14 DIAGNOSIS — Z87891 Personal history of nicotine dependence: Secondary | ICD-10-CM | POA: Insufficient documentation

## 2023-02-14 DIAGNOSIS — R739 Hyperglycemia, unspecified: Secondary | ICD-10-CM

## 2023-02-14 HISTORY — DX: Type 2 diabetes mellitus without complications: E11.9

## 2023-02-14 LAB — CBC
HCT: 41 % (ref 39.0–52.0)
Hemoglobin: 14.5 g/dL (ref 13.0–17.0)
MCH: 32.4 pg (ref 26.0–34.0)
MCHC: 35.4 g/dL (ref 30.0–36.0)
MCV: 91.5 fL (ref 80.0–100.0)
Platelets: 149 10*3/uL — ABNORMAL LOW (ref 150–400)
RBC: 4.48 MIL/uL (ref 4.22–5.81)
RDW: 13.2 % (ref 11.5–15.5)
WBC: 6.9 10*3/uL (ref 4.0–10.5)
nRBC: 0 % (ref 0.0–0.2)

## 2023-02-14 LAB — COMPREHENSIVE METABOLIC PANEL
ALT: 22 U/L (ref 0–44)
AST: 18 U/L (ref 15–41)
Albumin: 3.6 g/dL (ref 3.5–5.0)
Alkaline Phosphatase: 84 U/L (ref 38–126)
Anion gap: 12 (ref 5–15)
BUN: 15 mg/dL (ref 8–23)
CO2: 21 mmol/L — ABNORMAL LOW (ref 22–32)
Calcium: 9.7 mg/dL (ref 8.9–10.3)
Chloride: 98 mmol/L (ref 98–111)
Creatinine, Ser: 1.09 mg/dL (ref 0.61–1.24)
GFR, Estimated: >60 mL/min (ref >60)
Glucose, Bld: 548 mg/dL (ref 70–99)
Potassium: 4.5 mmol/L (ref 3.5–5.1)
Sodium: 131 mmol/L — ABNORMAL LOW (ref 135–145)
Total Bilirubin: 0.6 mg/dL (ref 0.0–1.2)
Total Protein: 6.6 g/dL (ref 6.5–8.1)

## 2023-02-14 LAB — CBG MONITORING, ED
Glucose-Capillary: 534 mg/dL (ref 70–99)
Glucose-Capillary: 393 mg/dL — ABNORMAL HIGH (ref 70–99)
Glucose-Capillary: 301 mg/dL — ABNORMAL HIGH (ref 70–99)

## 2023-02-14 LAB — BLOOD GAS, VENOUS
Acid-Base Excess: 1.7 mmol/L (ref 0.0–2.0)
Bicarbonate: 23.7 mmol/L (ref 20.0–28.0)
Drawn by: 1517
O2 Saturation: 95.4 %
Patient temperature: 37.2
pCO2, Ven: 29 mm[Hg] — ABNORMAL LOW (ref 44–60)
pH, Ven: 7.52 — ABNORMAL HIGH (ref 7.25–7.43)
pO2, Ven: 66 mm[Hg] — ABNORMAL HIGH (ref 32–45)

## 2023-02-14 LAB — BETA-HYDROXYBUTYRIC ACID: Beta-Hydroxybutyric Acid: 0.29 mmol/L — ABNORMAL HIGH (ref 0.05–0.27)

## 2023-02-14 LAB — ETHANOL: Alcohol, Ethyl (B): <10 mg/dL (ref <10)

## 2023-02-14 MED ORDER — SODIUM CHLORIDE 0.9 % IV BOLUS
1000.0000 mL | Freq: Once | INTRAVENOUS | Status: AC
  Administered 2023-02-14: 1000 mL via INTRAVENOUS

## 2023-02-14 MED ORDER — INSULIN ASPART 100 UNIT/ML IV SOLN
10.0000 [IU] | Freq: Once | INTRAVENOUS | Status: AC
  Administered 2023-02-14: 10 [IU] via INTRAVENOUS

## 2023-02-14 NOTE — ED Provider Notes (Signed)
 Signout from Dr. Theadore.  83 year old male diabetic here with general weakness.  Workup shows significant elevation in glucose.  No evidence of DKA.  Getting IV fluids and subcu insulin .  Plan is reassessment and recheck blood sugars.  If normalizing and his symptoms improved likely can be discharged. Physical Exam  BP 127/79   Pulse 74   Temp 98.9 F (37.2 C) (Oral)   Resp 20   Ht 5' 10 (1.778 m)   Wt 81.6 kg   SpO2 95%   BMI 25.83 kg/m   Physical Exam  Procedures  Procedures  ED Course / MDM    Medical Decision Making Amount and/or Complexity of Data Reviewed Labs: ordered. Radiology: ordered. ECG/medicine tests: ordered.  Risk OTC drugs.   Patient feels better after fluids and insulin .  Glucose normalizing to almost 300.  Recommended close follow-up with PCP.  Return instructions discussed       Towana Ozell BROCKS, MD 02/14/23 [phone]

## 2023-02-14 NOTE — ED Triage Notes (Signed)
 Pt to ED from home via [facility], called 911 after becoming too weak to walk tonight. EMS stated glucometer read hi; pt takes metformin for diabetes. PIV 18g LFA, fluid bolus started (pt received 300ml normal saline en route). Last blood sugar 567 pta. Normally walks without assistive device.

## 2023-02-14 NOTE — ED Provider Notes (Signed)
 [facility] Emergency Department Provider Note MRN:  [id number]  Arrival date & time: 02/14/23     Chief Complaint   Weakness   History of Present Illness   William Khan is a 83 y.o. year-old male with a history of hypertension, diabetes presenting to the ED with chief complaint of weakness.  Generally weak, so weak having trouble walking, EMS was called, hyperglycemic, reportedly was eating candy this evening.  Currently patient denies symptoms, feels normal.  Review of Systems  A thorough review of systems was obtained and all systems are negative except as noted in the HPI and PMH.   Patient's Health History    Past Medical History:  Diagnosis Date   Diabetes mellitus without complication (HCC)    Hyperlipidemia    Hypertension     Past Surgical History:  Procedure Laterality Date   SHOULDER SURGERY      Family History  Problem Relation Age of Onset   Arthritis Mother    Hypertension Mother    Alzheimer's disease Mother     Social History   Socioeconomic History   Marital status: Married    Spouse name: Not on file   Number of children: 2   Years of education: Not on file   Highest education level: Some college, no degree  Occupational History   Occupation: retired  Tobacco Use   Smoking status: Former    Current packs/day: 0.00    Types: Cigarettes    Start date: 01/05/1970    Quit date: 01/05/1990    Years since quitting: 33.1   Smokeless tobacco: Never  Vaping Use   Vaping status: Never Used  Substance and Sexual Activity   Alcohol  use: No   Drug use: No   Sexual activity: Not on file  Other Topics Concern   Not on file  Social History Narrative   Not on file   Social Drivers of Health   Financial Resource Strain: Low Risk  (08/06/2021)   Overall Financial Resource Strain (CARDIA)    Difficulty of Paying Living Expenses: Not hard at all  Food Insecurity: No Food Insecurity (08/06/2021)   Hunger Vital Sign    Worried About  Running Out of Food in the Last Year: Never true    Ran Out of Food in the Last Year: Never true  Transportation Needs: No Transportation Needs (08/06/2021)   PRAPARE - Administrator, Civil Service (Medical): No    Lack of Transportation (Non-Medical): No  Physical Activity: Insufficiently Active (08/06/2021)   Exercise Vital Sign    Days of Exercise per Week: 3 days    Minutes of Exercise per Session: 30 min  Stress: No Stress Concern Present (08/06/2021)   [employer] of Occupational Health - Occupational Stress Questionnaire    Feeling of Stress : Not at all  Social Connections: Moderately Isolated (08/06/2021)   Social Connection and Isolation Panel [NHANES]    Frequency of Communication with Friends and Family: Twice a week    Frequency of Social Gatherings with Friends and Family: Once a week    Attends Religious Services: Never    Database Administrator or Organizations: No    Attends Banker Meetings: Never    Marital Status: Married  Catering Manager Violence: Not At Risk (08/06/2021)   Humiliation, Afraid, Rape, and Kick questionnaire    Fear of Current or Ex-Partner: No    Emotionally Abused: No    Physically Abused: No  Sexually Abused: No     Physical Exam   Vitals:   02/14/23 0430 02/14/23 0438  BP: 127/79   Pulse: 74   Resp: 20   Temp:  98.9 F (37.2 C)  SpO2: 95%     CONSTITUTIONAL: Well-appearing, NAD NEURO/PSYCH:  Alert and oriented x 3, no focal deficits EYES:  eyes equal and reactive ENT/NECK:  no LAD, no JVD CARDIO: Regular rate, well-perfused, normal S1 and S2 PULM:  CTAB no wheezing or rhonchi GI/GU:  non-distended, non-tender MSK/SPINE:  No gross deformities, no edema SKIN: Bruising to the right periorbital region   *Additional and/or pertinent findings included in MDM below  Diagnostic and Interventional Summary    EKG Interpretation Date/Time:  Sunday February 14 2023 04:29:09 EST Ventricular Rate:  77 PR  Interval:  177 QRS Duration:  114 QT Interval:  402 QTC Calculation: 455 R Axis:   11  Text Interpretation: Sinus rhythm Multiple ventricular premature complexes Abnormal R-wave progression, early transition Inferior infarct, age indeterminate Confirmed by Theadore Sharper [phone]) on 02/14/2023 5:16:42 AM       Labs Reviewed  CBC - Abnormal; Notable for the following components:      Result Value   Platelets 149 (*)    All other components within normal limits  COMPREHENSIVE METABOLIC PANEL - Abnormal; Notable for the following components:   Sodium 131 (*)    CO2 21 (*)    Glucose, Bld 548 (*)    All other components within normal limits  BLOOD GAS, VENOUS - Abnormal; Notable for the following components:   pH, Ven 7.52 (*)    pCO2, Ven 29 (*)    pO2, Ven 66 (*)    All other components within normal limits  BETA-HYDROXYBUTYRIC ACID - Abnormal; Notable for the following components:   Beta-Hydroxybutyric Acid 0.29 (*)    All other components within normal limits  CBG MONITORING, ED - Abnormal; Notable for the following components:   Glucose-Capillary 534 (*)    All other components within normal limits  ETHANOL    CT HEAD WO CONTRAST ( )  Final Result      Medications  insulin  aspart (novoLOG ) injection 10 Units (has no administration in time range)  sodium chloride  0.9 % bolus 1,000 mL (has no administration in time range)     Procedures  /  Critical Care Procedures  ED Course and Medical Decision Making  Initial Impression and Ddx Hyperglycemia, differential diagnosis includes DKA, underlying infection considered however patient has no symptoms, looks well.  Awaiting labs.  Past medical/surgical history that increases complexity of ED encounter: Diabetes  Interpretation of Diagnostics I personally reviewed the EKG and my interpretation is as follows: Sinus rhythm  Labs reveal hyperglycemia, pseudohyponatremia, otherwise no significant blood count or electrolyte  disturbance  Patient Reassessment and Ultimate Disposition/Management     No signs of DKA, normal vitals, patient continues to have no complaints.  Providing insulin , fluids, will ensure that sugar is downtrending, anticipating discharge.  Signed out to oncoming provider at shift change.  Patient management required discussion with the following services or consulting groups:  None  Complexity of Problems Addressed Acute illness or injury that poses threat of life of bodily function  Additional Data Reviewed and Analyzed Further history obtained from: EMS on arrival  Additional Factors Impacting ED Encounter Risk Consideration of hospitalization  Sharper HERO. Theadore, MD [facility] Emergency Medicine [facility] [email or web address]  Final Clinical Impressions(s) / ED Diagnoses     ICD-10-CM  1. Hyperglycemia  R73.9       ED Discharge Orders     None        Discharge Instructions Discussed with and Provided to Patient:   Discharge Instructions   None      Theadore Ozell HERO, MD 02/14/23 [phone]

## 2023-02-14 NOTE — ED Notes (Signed)
 Per MD pt can have water. Water was given to pt

## 2023-03-02 NOTE — Progress Notes (Signed)
 Elmer Sow. Pilar Plate, MD [facility] Emergency [facility] [email or web address]

## 2023-03-18 ENCOUNTER — Other Ambulatory Visit: Payer: Self-pay | Admitting: Physician Assistant

## 2023-03-18 DIAGNOSIS — R0981 Nasal congestion: Secondary | ICD-10-CM

## 2024-01-24 IMAGING — CR DG CHEST 2V
1 series · 2 of 2 positions shown · non-contrast
Comparison: 05/24/2014

CLINICAL DATA: Cough and chest tightness. Abnormal lung sounds on
auscultation. Fatigue for 3 or 4 months.

EXAM:
CHEST - 2 VIEW

[Series 1: dg chest 2 view · 0.14mm/px · 2 of 2 slices shown]
[im 1/2]
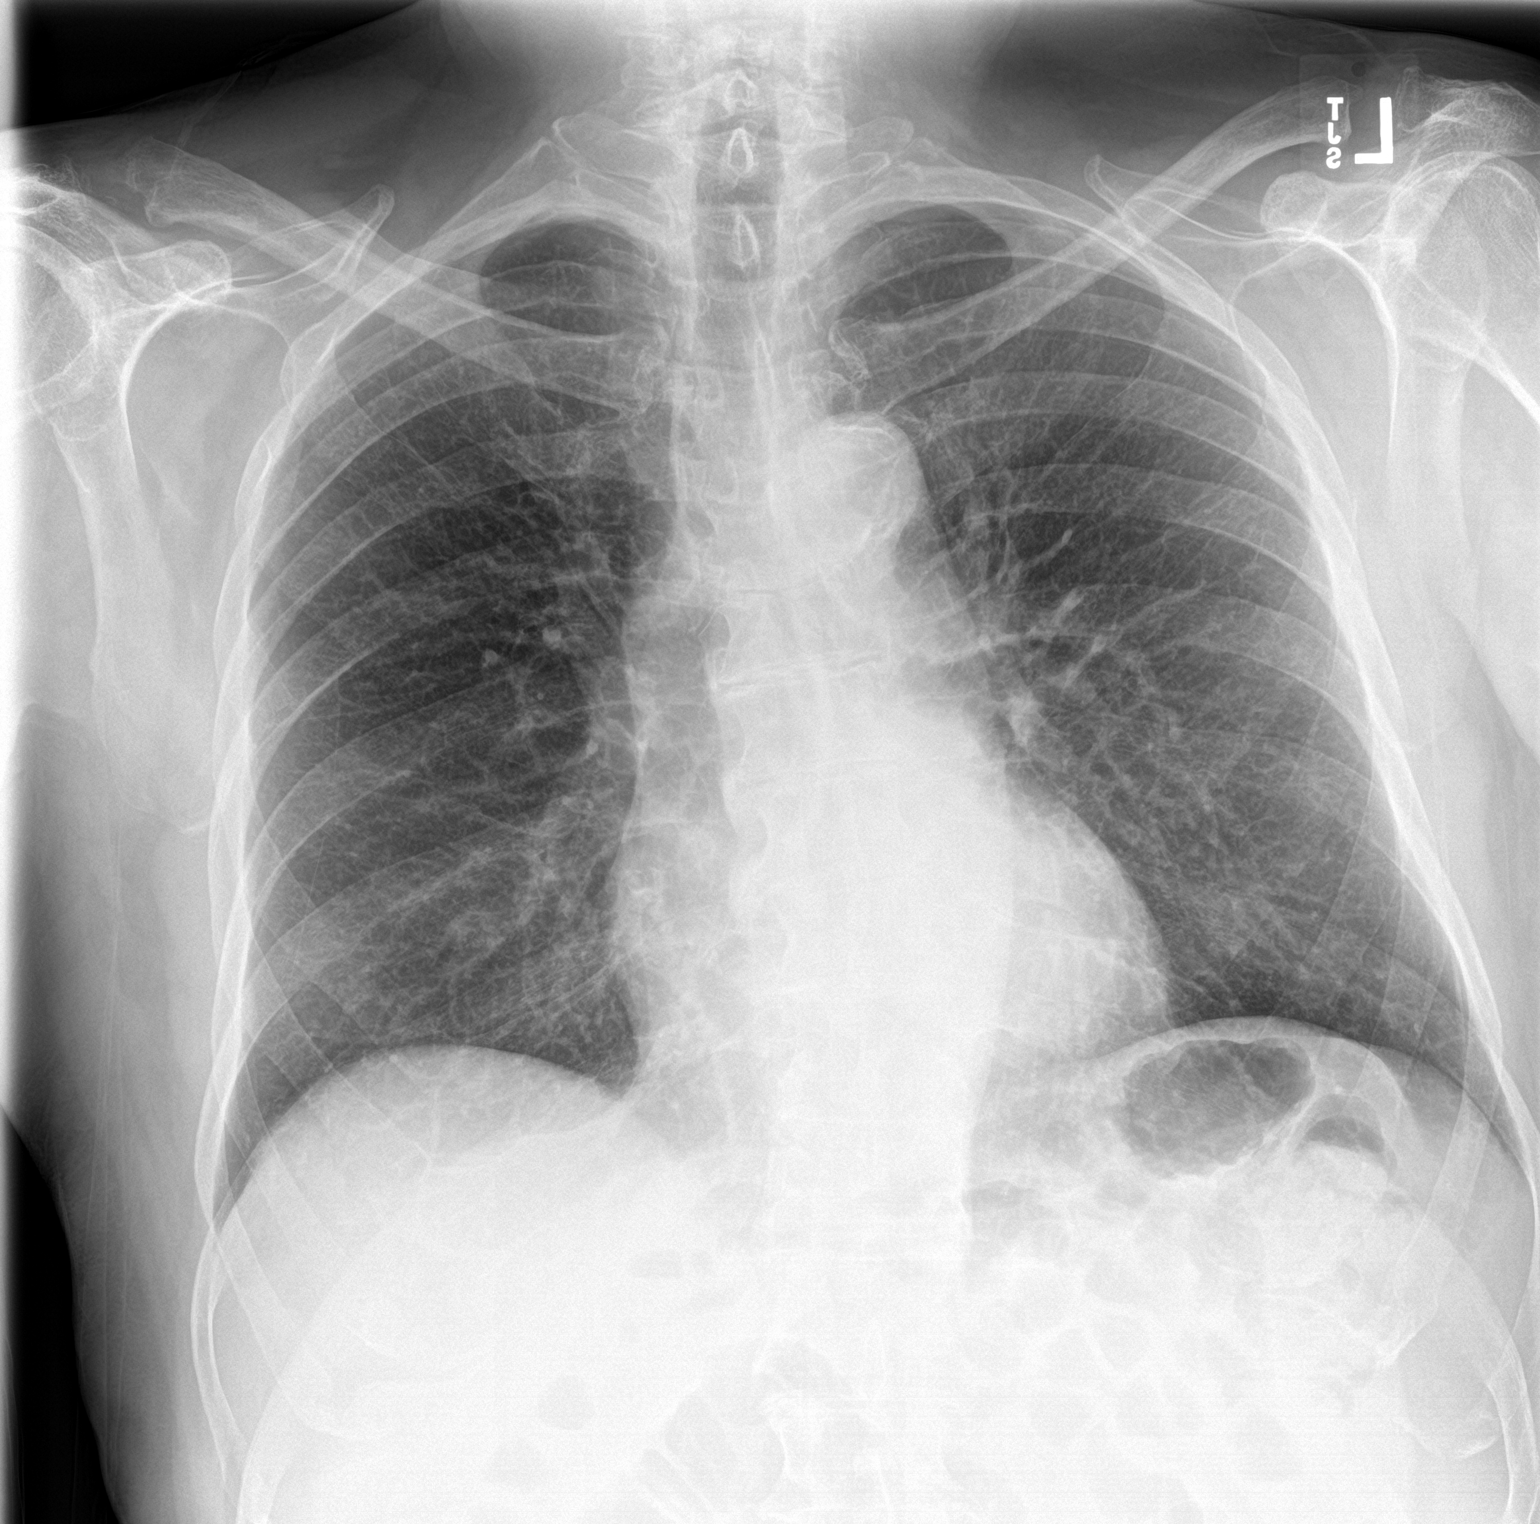
[im 2/2]
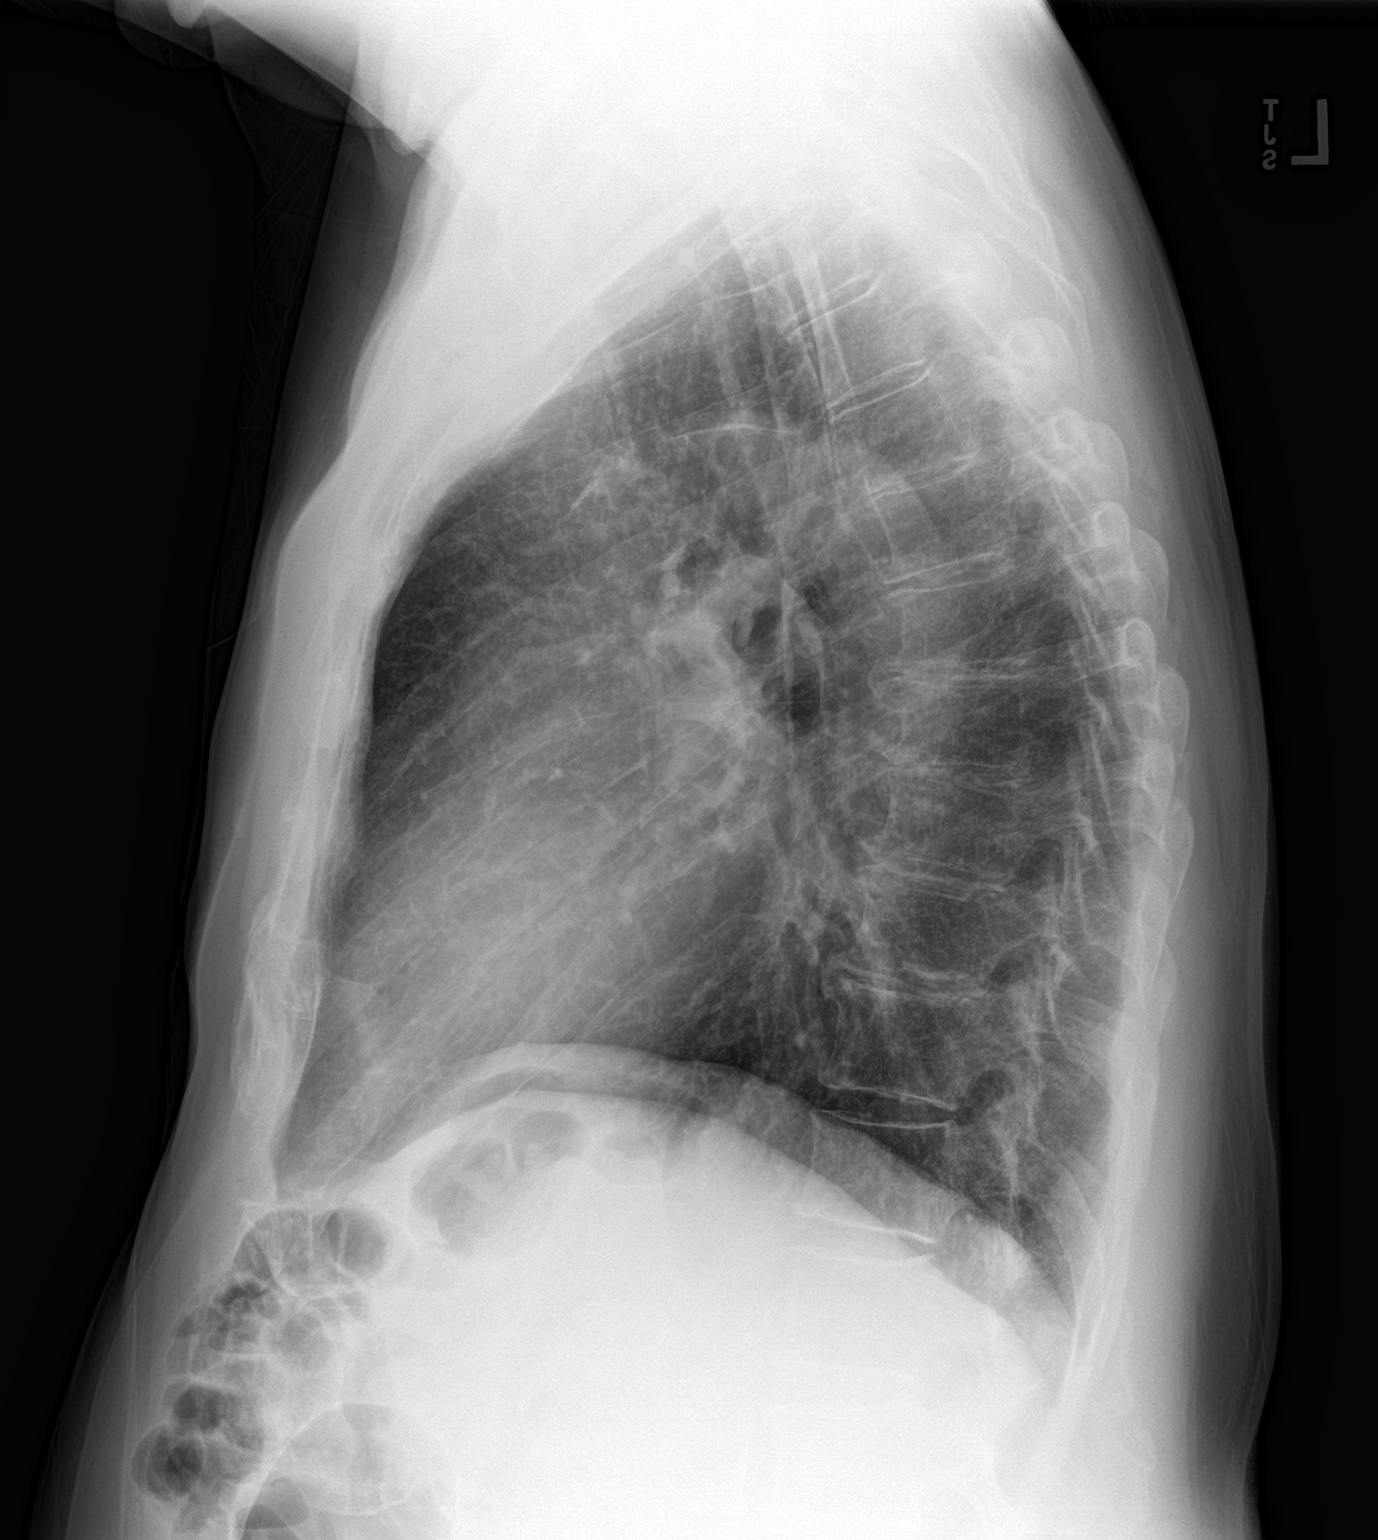

[2 of 2 positions shown; findings below may reference images not displayed]

FINDINGS: The heart is normal in size. Normal mediastinal contours. Moderate
aortic atherosclerosis. There is mild interstitial and bronchial
thickening that is progressed from prior exam. No focal airspace
disease. No evidence of nodule or mass. No pleural fluid or
pneumothorax. Degenerative change in the thoracic spine with slight
scoliosis.
IMPRESSION: Mild interstitial and bronchial thickening, progressed from prior
exam. Findings may represent bronchitis or atypical infection.
